# Patient Record
Sex: Male | Born: 1975 | Race: White | Hispanic: No | Marital: Married | Smoking: Former smoker
Health system: Southern US, Community
[De-identification: ages and names within clinical notes are randomized; demographics above are authoritative.]

## PROBLEM LIST (undated history)

## (undated) DIAGNOSIS — K219 Gastro-esophageal reflux disease without esophagitis: Secondary | ICD-10-CM

## (undated) DIAGNOSIS — Z8489 Family history of other specified conditions: Secondary | ICD-10-CM

## (undated) DIAGNOSIS — G4730 Sleep apnea, unspecified: Secondary | ICD-10-CM

## (undated) DIAGNOSIS — J309 Allergic rhinitis, unspecified: Secondary | ICD-10-CM

## (undated) HISTORY — PX: HERNIA REPAIR: SHX51

## (undated) HISTORY — PX: DIAGNOSTIC LAPAROSCOPY: SUR761

---

## 2004-07-10 ENCOUNTER — Emergency Department: Payer: Self-pay | Admitting: Unknown Physician Specialty

## 2005-11-05 ENCOUNTER — Emergency Department: Payer: Self-pay | Admitting: Emergency Medicine

## 2007-03-11 HISTORY — PX: NASAL SEPTUM SURGERY: SHX37

## 2007-04-13 ENCOUNTER — Ambulatory Visit: Payer: Self-pay | Admitting: Internal Medicine

## 2007-11-01 ENCOUNTER — Ambulatory Visit: Payer: Self-pay | Admitting: Otolaryngology

## 2008-11-28 ENCOUNTER — Ambulatory Visit: Payer: Self-pay | Admitting: Internal Medicine

## 2009-03-10 HISTORY — PX: ACHILLES TENDON SURGERY: SHX542

## 2011-03-11 HISTORY — PX: COLON SURGERY: SHX602

## 2011-10-16 ENCOUNTER — Ambulatory Visit: Payer: Self-pay

## 2011-12-16 ENCOUNTER — Ambulatory Visit: Payer: Self-pay | Admitting: Internal Medicine

## 2011-12-16 ENCOUNTER — Inpatient Hospital Stay: Payer: Self-pay | Admitting: Surgery

## 2011-12-16 LAB — COMPREHENSIVE METABOLIC PANEL
Anion Gap: 8 (ref 7–16)
BUN: 7 mg/dL (ref 7–18)
Calcium, Total: 9.4 mg/dL (ref 8.5–10.1)
Co2: 27 mmol/L (ref 21–32)
EGFR (Non-African Amer.): >60
Osmolality: 271 (ref 275–301)
Potassium: 3.8 mmol/L (ref 3.5–5.1)
SGOT(AST): 15 U/L (ref 15–37)
SGPT (ALT): 34 U/L (ref 12–78)
Sodium: 137 mmol/L (ref 136–145)

## 2011-12-16 LAB — CBC WITH DIFFERENTIAL/PLATELET
Basophil #: 0.1 10*3/uL (ref 0.0–0.1)
Eosinophil #: 0.2 10*3/uL (ref 0.0–0.7)
Lymphocyte %: 19.8 %
MCHC: 34 g/dL (ref 32.0–36.0)
MCV: 87 fL (ref 80–100)
Monocyte #: 1.1 x10 3/mm — ABNORMAL HIGH (ref 0.2–1.0)
Monocyte %: 7.9 %
Neutrophil #: 10.1 10*3/uL — ABNORMAL HIGH (ref 1.4–6.5)
Neutrophil %: 69.8 %
Platelet: 276 10*3/uL (ref 150–440)
RBC: 5.13 10*6/uL (ref 4.40–5.90)
RDW: 13.6 % (ref 11.5–14.5)
WBC: 14.4 10*3/uL — ABNORMAL HIGH (ref 3.8–10.6)

## 2011-12-16 LAB — URINALYSIS, COMPLETE
Bilirubin,UR: NEGATIVE
Glucose,UR: NEGATIVE mg/dL (ref 0–75)
Ketone: NEGATIVE
Nitrite: NEGATIVE
Ph: 6 (ref 4.5–8.0)
Protein: NEGATIVE
Specific Gravity: 1.02 (ref 1.003–1.030)

## 2011-12-17 LAB — BASIC METABOLIC PANEL
Calcium, Total: 8.8 mg/dL (ref 8.5–10.1)
Chloride: 104 mmol/L (ref 98–107)
Co2: 24 mmol/L (ref 21–32)
Creatinine: 0.82 mg/dL (ref 0.60–1.30)
EGFR (African American): >60
EGFR (Non-African Amer.): >60
Glucose: 110 mg/dL — ABNORMAL HIGH (ref 65–99)
Sodium: 137 mmol/L (ref 136–145)

## 2011-12-17 LAB — CBC WITH DIFFERENTIAL/PLATELET
Basophil %: 0.5 %
Eosinophil #: 0.1 10*3/uL (ref 0.0–0.7)
HCT: 42 % (ref 40.0–52.0)
HGB: 14.4 g/dL (ref 13.0–18.0)
Lymphocyte #: 1.4 10*3/uL (ref 1.0–3.6)
Lymphocyte %: 12 %
MCHC: 34.3 g/dL (ref 32.0–36.0)
Monocyte %: 7.5 %
Neutrophil #: 9.4 10*3/uL — ABNORMAL HIGH (ref 1.4–6.5)
Platelet: 279 10*3/uL (ref 150–440)
RDW: 13.7 % (ref 11.5–14.5)
WBC: 11.9 10*3/uL — ABNORMAL HIGH (ref 3.8–10.6)

## 2011-12-18 LAB — BASIC METABOLIC PANEL
Anion Gap: 11 (ref 7–16)
Calcium, Total: 9.1 mg/dL (ref 8.5–10.1)
Chloride: 102 mmol/L (ref 98–107)
Glucose: 104 mg/dL — ABNORMAL HIGH (ref 65–99)
Osmolality: 271 (ref 275–301)
Potassium: 3.9 mmol/L (ref 3.5–5.1)

## 2011-12-18 LAB — CBC WITH DIFFERENTIAL/PLATELET
Basophil %: 0.7 %
HCT: 40.3 % (ref 40.0–52.0)
HGB: 13.8 g/dL (ref 13.0–18.0)
Lymphocyte #: 1.9 10*3/uL (ref 1.0–3.6)
Lymphocyte %: 14.8 %
MCH: 29.6 pg (ref 26.0–34.0)
MCHC: 34.3 g/dL (ref 32.0–36.0)
MCV: 87 fL (ref 80–100)
Neutrophil #: 9.7 10*3/uL — ABNORMAL HIGH (ref 1.4–6.5)
Platelet: 279 10*3/uL (ref 150–440)
RDW: 13.3 % (ref 11.5–14.5)

## 2011-12-19 LAB — CBC WITH DIFFERENTIAL/PLATELET
Basophil %: 1.1 %
Eosinophil #: 0.2 10*3/uL (ref 0.0–0.7)
Eosinophil %: 2.1 %
Lymphocyte #: 1.9 10*3/uL (ref 1.0–3.6)
MCH: 30.7 pg (ref 26.0–34.0)
MCHC: 35.2 g/dL (ref 32.0–36.0)
MCV: 87 fL (ref 80–100)
Monocyte #: 0.6 x10 3/mm (ref 0.2–1.0)
Neutrophil %: 65.4 %
Platelet: 280 10*3/uL (ref 150–440)
RBC: 5.01 10*6/uL (ref 4.40–5.90)

## 2011-12-22 LAB — CULTURE, BLOOD (SINGLE)

## 2012-03-01 ENCOUNTER — Ambulatory Visit: Payer: Self-pay | Admitting: Unknown Physician Specialty

## 2012-04-09 ENCOUNTER — Ambulatory Visit: Payer: Self-pay | Admitting: Unknown Physician Specialty

## 2012-04-13 LAB — PATHOLOGY REPORT

## 2013-01-06 IMAGING — CR DG CHEST 2V
1 series · 2 of 2 positions shown · non-contrast
Comparison: none

REASON FOR EXAM: severe cough X 3 days
COMMENTS:

PROCEDURE:     MDR - MDR CHEST PA(OR AP) AND LATERAL  - October 16, 2011 [DATE]
RESULT:     The lungs are clear. The heart and pulmonary vessels are normal.
The bony and mediastinal structures are unremarkable. There is no effusion.
There is no pneumothorax or evidence of congestive failure.

[Series 1: pa · 0.17mm/px · 2 of 2 slices shown]
[im 1/2]
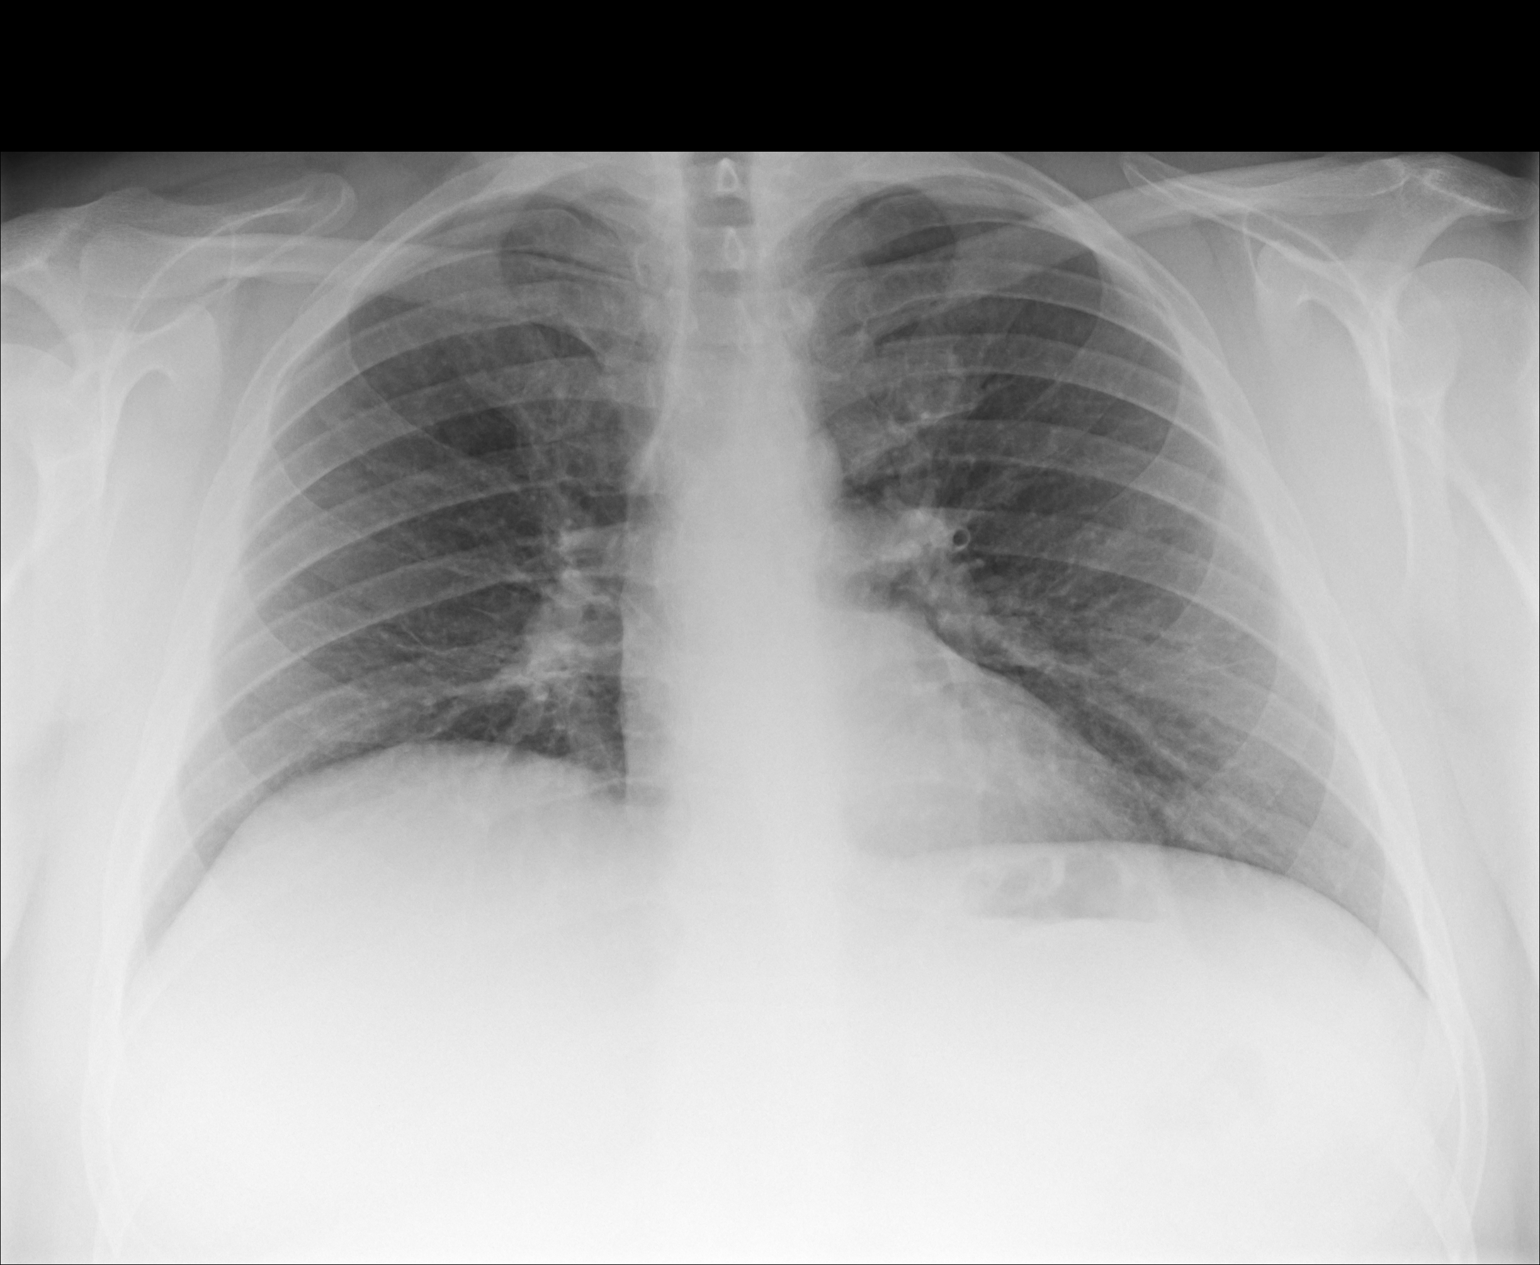
[im 2/2]
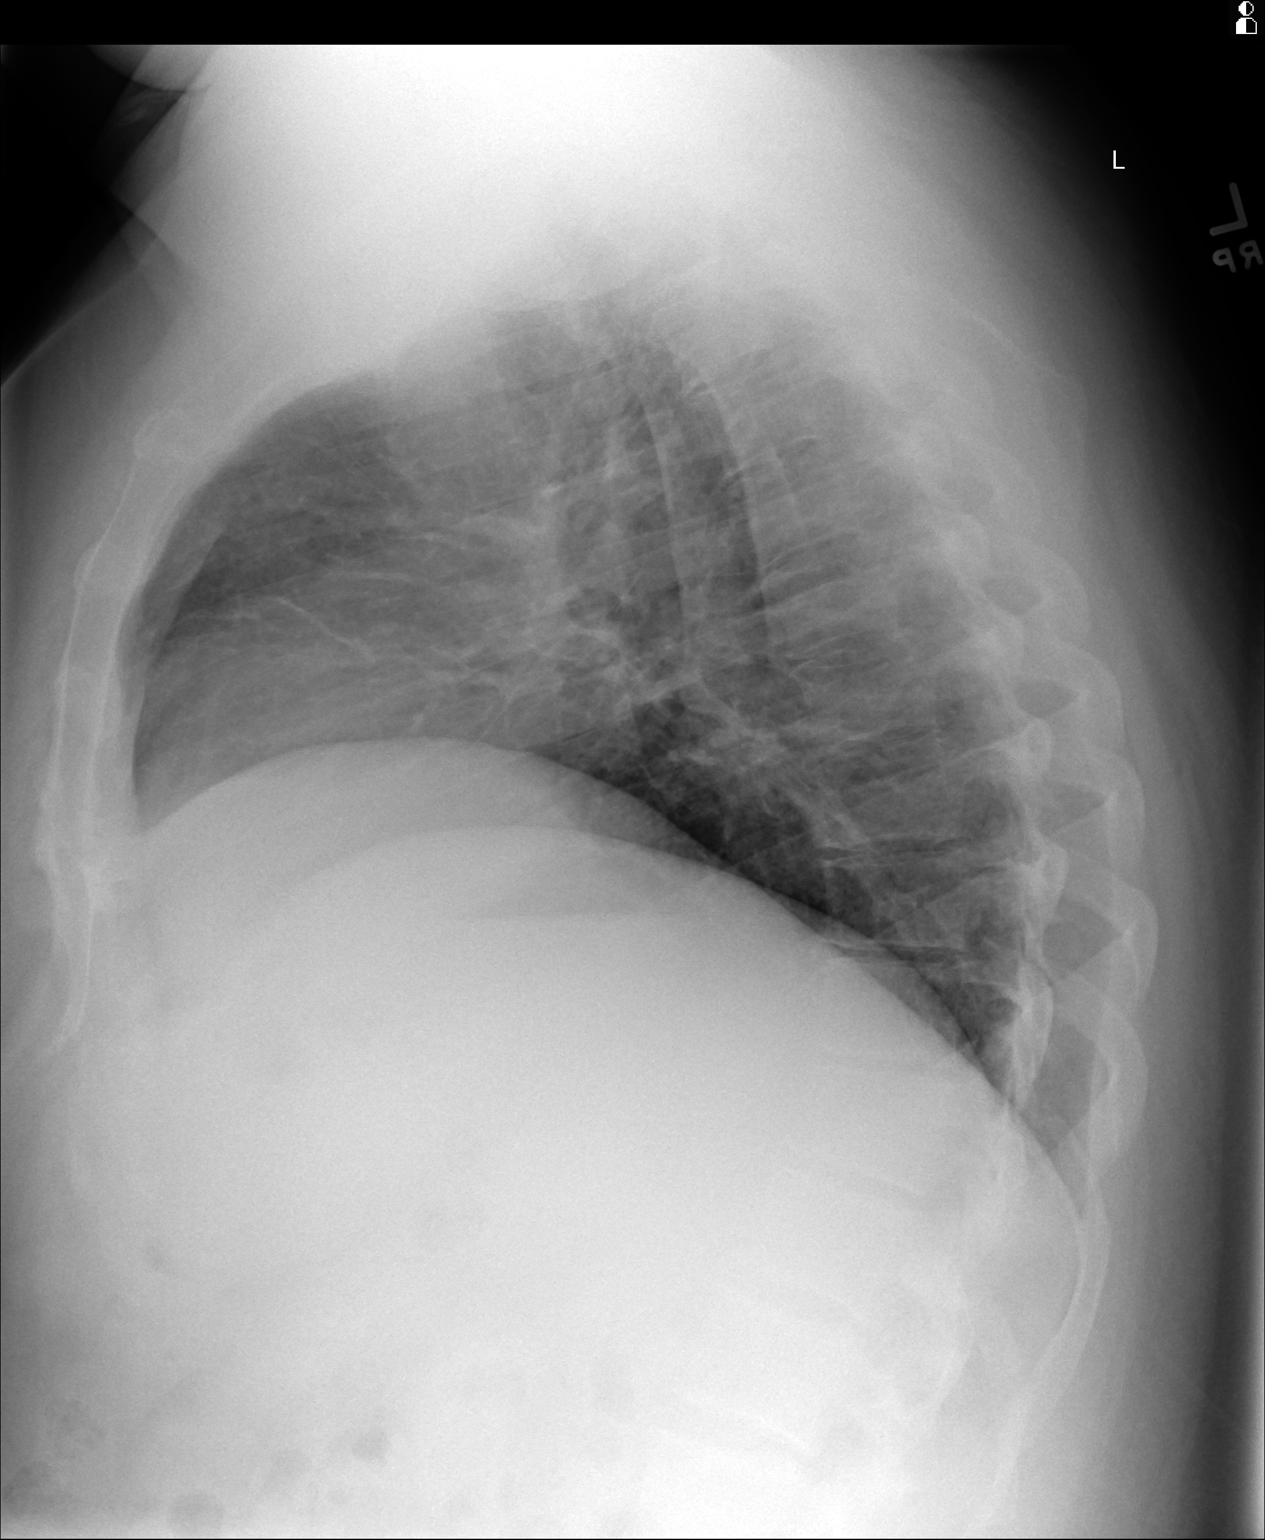

[2 of 2 positions shown; findings below may reference images not displayed]

IMPRESSION: No acute cardiopulmonary disease.

[REDACTED]

## 2013-03-15 ENCOUNTER — Ambulatory Visit: Payer: Self-pay | Admitting: Physician Assistant

## 2013-08-03 ENCOUNTER — Ambulatory Visit: Payer: Self-pay | Admitting: Physician Assistant

## 2013-08-03 LAB — RAPID STREP-A WITH REFLX: Micro Text Report: NEGATIVE

## 2013-08-07 LAB — BETA STREP CULTURE(ARMC)

## 2013-08-08 ENCOUNTER — Ambulatory Visit: Payer: Self-pay | Admitting: Physician Assistant

## 2013-12-22 ENCOUNTER — Ambulatory Visit: Payer: Self-pay | Admitting: Specialist

## 2014-03-19 ENCOUNTER — Ambulatory Visit: Payer: Self-pay | Admitting: Physician Assistant

## 2014-05-01 ENCOUNTER — Ambulatory Visit: Payer: Self-pay | Admitting: Family Medicine

## 2014-06-27 NOTE — Discharge Summary (Signed)
PATIENT NAME:  Jeremy [name], Jeremy [name] MR#:  [id number] DATE OF BIRTH:  05-Oct-1975  DATE OF ADMISSION:  12/16/2011 DATE OF DISCHARGE:  12/19/2011  DISCHARGE DIAGNOSIS: Acute diverticulitis.   PROCEDURES: None.   HISTORY OF PRESENT ILLNESS/HOSPITAL COURSE: This is a 39 year old male patient with a history of progressive and worsening left lower quadrant pain. He has had some nausea but no emesis. A work-up in the Emergency Room suggested acute diverticulitis. He was started on IV antibiotics and within 24 hours his pain had improved. He was kept on IV antibiotics and is currently being switched to oral antibiotics in the form of Bactrim and Flagyl. He is tolerating a regular diet. His pain is completely resolved. He will follow up in our office in 10 days for re-evaluation. He is given instructions to return to the office or hospital if he were to worsen. He is tolerating a diet and is discharged on Bactrim, Flagyl and Vicodin as needed.  ____________________________ Adah Salvageichard E. Excell Seltzerooper, MD rec:cms D: 12/19/2011 14:41:48 ET T: 12/19/2011 15:44:39 ET JOB#: [id number]  cc: Adah Salvageichard E. Excell Seltzerooper, MD, <Dictator> Lattie HawICHARD E Lafaye Mcelmurry MD ELECTRONICALLY SIGNED 12/19/2011 17:56

## 2014-06-27 NOTE — H&P (Signed)
PATIENT NAME:  Jeremy [name], Jeremy [name] MR#:  [id number] DATE OF BIRTH:  08/27/1975  DATE OF ADMISSION:  12/16/2011  PRIMARY CARE PHYSICIAN: None ADMITTING PHYSICIAN: Dr. Michela PitcherEly  CHIEF COMPLAINT: Abdominal pain.   BRIEF HISTORY: Jeremy [name] is a 39 year old gentleman seen in the Emergency Room with an 18 hour history of significant left lower quadrant pain. His pain came on suddenly with no real antecedent events. He did have some moderate pain several days ago which he felt was gas but did not have any serious episodes until late yesterday. Pain was sudden in onset with marked left lower quadrant pain. He had no nausea, no vomiting, no diarrhea. His wife relates he did have one bloody bowel movement several days ago. He minimizes his symptoms. He has had no previous similar symptoms. Denies any history of hepatitis, yellow jaundice, pancreatitis, peptic ulcer disease, gallbladder disease or diverticulitis. He has had a previous right inguinal hernia repair is his only previous surgery. Does not have a regular family physician. Denies any history of cardiac disease, hypertension or diabetes. His only major medical problem is gastroesophageal reflux disease. He does take Prilosec 40 mg p.o. [name].i.d. He has no medical allergies. He does not smoke cigarettes but does chew tobacco. He does not drink significant amounts of alcohol and works as a Medical illustratorsalesman.   FAMILY HISTORY: Noncontributory.    REVIEW OF SYSTEMS: Review of systems was undertaken and no significant abnormality was identified other than those noted above.   PHYSICAL EXAMINATION:  VITAL SIGNS: Blood pressure 170/76, heart rate 80 and regular, respirations 18, temperature 99.3. He is having a bit of a chill during out examination.   HEENT: No scleral icterus. No facial deformity. Normal pupils.   NECK: Supple, nontender with no adenopathy and normal midline trachea.   CHEST: Clear with no adventitious sounds and normal pulmonary excursion.   CARDIAC:  No murmurs or gallops. He seems to be in normal sinus rhythm.   ABDOMEN: Soft. He does have marked left lower quadrant tenderness with guarding. He has no rebound. He has hypoactive but present bowel sounds.   EXTREMITIES: Lower extremity exam reveals full range of motion. No deformities.   PSYCHIATRIC: Normal orientation, normal affect.   ASSESSMENT AND PLAN: I have independently reviewed the CT scan which does demonstrate what appears to be a small foci of air outside his colon which would suggest diverticulitis with a microperforation. At this point I think he would benefit with his fever, shaking chill and left lower quadrant pain from IV antibiotics. This plan has been outlined to him and his wife in detail. Questions have been answered. We will plan for admission this evening.   ____________________________ Carmie Endalph L. Ely III, MD rle:cms D: 12/16/2011 21:14:04 ET T: 12/17/2011 06:49:42 ET JOB#: [id number]  cc: Quentin Orealph L. Ely III, MD, <Dictator>  Quentin OreALPH L ELY MD ELECTRONICALLY SIGNED 12/19/2011 22:51

## 2014-11-08 ENCOUNTER — Ambulatory Visit: Payer: BLUE CROSS/BLUE SHIELD

## 2014-11-08 ENCOUNTER — Encounter: Payer: Self-pay | Admitting: Emergency Medicine

## 2014-11-08 ENCOUNTER — Ambulatory Visit
Admission: EM | Admit: 2014-11-08 | Discharge: 2014-11-08 | Disposition: A | Discharge status: Home / Self Care | Payer: BLUE CROSS/BLUE SHIELD | Attending: Emergency Medicine | Admitting: Emergency Medicine

## 2014-11-08 DIAGNOSIS — S40021A Contusion of right upper arm, initial encounter: Secondary | ICD-10-CM

## 2014-11-08 NOTE — ED Notes (Signed)
Pt hit in right forearm with a soft ball x 1 day

## 2014-11-08 NOTE — ED Provider Notes (Signed)
CSN: [id number]     Arrival date & time 11/08/14  1617 History   First MD Initiated Contact with Patient 11/08/14 1704     Chief Complaint  Patient presents with  . Arm Injury   (Consider location/radiation/quality/duration/timing/severity/associated sxs/prior Treatment) HPI   A 39 year old male who was reaching in a softball game last night when I hit ball struck him in his right distal radius. He states last night that he was having pain extending into his thumb. No numbness or tingling is present. He has some swelling and a bruise has developed where the ball hit him.  History reviewed. No pertinent past medical history. History reviewed. No pertinent past surgical history. History reviewed. No pertinent family history. Social History  Substance Use Topics  . Smoking status: Never Smoker   . Smokeless tobacco: None  . Alcohol Use: Yes    Review of Systems  Skin: Positive for color change.  All other systems reviewed and are negative.   Allergies  Review of patient's allergies indicates no known allergies.  Home Medications   Prior to Admission medications   Not on File   Meds Ordered and Administered this Visit  Medications - No data to display  BP 171/85 mmHg  Pulse 67  Temp(Src) 98.4 F (36.9 C) (Oral)  Resp 18  Ht 5' 11.5" (1.816 m)  Wt 298 lb (135.172 kg)  BMI 40.99 kg/m2  SpO2 99% No data found.   Physical Exam  Constitutional: He is oriented to person, place, and time. He appears well-developed and well-nourished.  HENT:  Head: Normocephalic and atraumatic.  Mouth/Throat: No oropharyngeal exudate.  Eyes: Pupils are equal, round, and reactive to light.  Musculoskeletal: He exhibits edema.  Examination of the right upper extremity shows a bruise over the radius distally approximate one hand's breath proximal to the radial styloid. Abduction of the thumb is intact and strong ; Finkelstein test does create some pain in the area  But there is no tenderness  over the distal radial styloid itself. Pronation and supination are full and intact. There is no hypesthesia to light touch.  Neurological: He is alert and oriented to person, place, and time.  Skin: Skin is warm and dry.  Psychiatric: He has a normal mood and affect. His behavior is normal. Judgment and thought content normal.  Nursing note and vitals reviewed.   ED Course  Procedures (including critical care time)  Labs Review Labs Reviewed - No data to display  Imaging Review Dg Forearm Right  11/08/2014   CLINICAL DATA:  Playing softball last night and injured distal radius along the radial aspect of the wrist.  EXAM: RIGHT FOREARM - 2 VIEW  COMPARISON:  None.  FINDINGS: There is no evidence of fracture or other focal bone lesions. Soft tissues are unremarkable.  IMPRESSION: Negative.   Electronically Signed   By: Elige Ko   On: 11/08/2014 18:01     Visual Acuity Review  Right Eye Distance:   Left Eye Distance:   Bilateral Distance:    Right Eye Near:   Left Eye Near:    Bilateral Near:         MDM   1. Contusion of arm, right, initial encounter    Plan: 1. Test/x-ray results and diagnosis reviewed with patient in detail 2. rx as per orders; risks, benefits, potential side effects reviewed with patient 3. Recommend supportive treatment with ice/elevation. Ibuprofen PRN pain 4. F/u prn if symptoms worsen or don't improve.  Lutricia Feil, PA-C 11/08/14 [phone]

## 2015-01-01 ENCOUNTER — Encounter: Payer: Self-pay | Admitting: Emergency Medicine

## 2015-01-01 ENCOUNTER — Ambulatory Visit
Admission: EM | Admit: 2015-01-01 | Discharge: 2015-01-01 | Disposition: A | Discharge status: Home / Self Care | Payer: BLUE CROSS/BLUE SHIELD | Attending: Family Medicine | Admitting: Family Medicine

## 2015-01-01 DIAGNOSIS — H6593 Unspecified nonsuppurative otitis media, bilateral: Secondary | ICD-10-CM

## 2015-01-01 DIAGNOSIS — J0111 Acute recurrent frontal sinusitis: Secondary | ICD-10-CM

## 2015-01-01 MED ORDER — ACETAMINOPHEN 500 MG PO TABS: 500.0000 mg | ORAL_TABLET | Freq: Four times a day (QID) | ORAL | Status: DC | PRN

## 2015-01-01 MED ORDER — FLUTICASONE PROPIONATE 50 MCG/ACT NA SUSP: 1.0000 | Freq: Two times a day (BID) | NASAL | Status: DC

## 2015-01-01 MED ORDER — SALINE SPRAY 0.65 % NA SOLN: 2.0000 | NASAL | Status: DC

## 2015-01-01 MED ORDER — AMOXICILLIN-POT CLAVULANATE 875-125 MG PO TABS: 1.0000 | ORAL_TABLET | Freq: Two times a day (BID) | ORAL | Status: DC

## 2015-01-01 NOTE — ED Provider Notes (Signed)
CSN: [id number]     Arrival date & time 01/01/15  0911 History   First MD Initiated Contact with Patient 01/01/15 [phone]     Chief Complaint  Patient presents with  . Facial Pain   (Consider location/radiation/quality/duration/timing/severity/associated sxs/prior Treatment) HPI Comments: Married caucasian male here for evaluation of sinus pressure/pain, foul smell in nose and taste in mouth and headache.  Feels like my last sinus infection and ran out of flonase.  Have been doing daily nasal rinses without any relief.  I do not use afrin anymore got hooked on it and daily use for months last year.  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  This year sinus infection x 2 otherwise previously years ago. FHx denied; PMHx: sinus infection this year  PSHx denied  The history is provided by the patient.    History reviewed. No pertinent past medical history. History reviewed. No pertinent past surgical history. History reviewed. No pertinent family history. Social History  Substance Use Topics  . Smoking status: Never Smoker   . Smokeless tobacco: None  . Alcohol Use: Yes    Review of Systems  Constitutional: Negative for fever, chills, diaphoresis, activity change, appetite change, fatigue and unexpected weight change.  HENT: Positive for congestion, postnasal drip and sinus pressure. Negative for dental problem, drooling, ear discharge, ear pain, facial swelling, hearing loss, mouth sores, nosebleeds, rhinorrhea, sneezing, sore throat, tinnitus, trouble swallowing and voice change.   Eyes: Negative for photophobia, pain, discharge, redness, itching and visual disturbance.  Respiratory: Negative for cough, choking, chest tightness, shortness of breath, wheezing and stridor.   Cardiovascular: Negative for chest pain, palpitations and leg swelling.  Gastrointestinal: Negative for nausea, vomiting, abdominal pain, diarrhea, constipation, blood in stool and abdominal distention.  Endocrine: Negative for cold  intolerance and heat intolerance.  Genitourinary: Negative for difficulty urinating.  Musculoskeletal: Negative for myalgias, back pain, joint swelling, arthralgias, gait problem, neck pain and neck stiffness.  Skin: Negative for color change, pallor, rash and wound.  Allergic/Immunologic: Negative for environmental allergies, food allergies and immunocompromised state.  Neurological: Positive for headaches. Negative for dizziness, tremors, seizures, syncope, facial asymmetry, speech difficulty, weakness, light-headedness and numbness.  Hematological: Negative for adenopathy. Does not bruise/bleed easily.  Psychiatric/Behavioral: Negative for behavioral problems, confusion, sleep disturbance and agitation.    Allergies  Review of patient's allergies indicates no known allergies.  Home Medications   Prior to Admission medications   Medication Sig Start Date End Date Taking? Authorizing Provider  acetaminophen (TYLENOL) 500 MG tablet Take 1 tablet (500 mg total) by mouth every 6 (six) hours as needed for moderate pain or fever. 01/01/15   Barbaraann Barthel, NP  amoxicillin-clavulanate (AUGMENTIN) 875-125 MG tablet Take 1 tablet by mouth every 12 (twelve) hours. 01/01/15   Barbaraann Barthel, NP  fluticasone (FLONASE) 50 MCG/ACT nasal spray Place 1 spray into both nostrils 2 (two) times daily. 01/01/15   Barbaraann Barthel, NP  sodium chloride (OCEAN) 0.65 % SOLN nasal spray Place 2 sprays into both nostrils every 2 (two) hours while awake. 01/01/15   Barbaraann Barthel, NP   Meds Ordered and Administered this Visit  Medications - No data to display  BP 148/85 mmHg  Pulse 73  Temp(Src) 98 F (36.7 C) (Tympanic)  Resp 20  Ht 5' 10.3" (1.786 m)  Wt 301 lb (136.533 kg)  BMI 42.80 kg/m2  SpO2 100% No data found.   Physical Exam  Constitutional: He is oriented to person, place, and time. Vital signs are  normal. He appears well-developed and well-nourished. He is active and cooperative.   Non-toxic appearance. He does not have a sickly appearance. He appears ill. No distress.  HENT:  Head: Normocephalic and atraumatic.  Right Ear: Hearing, external ear and ear canal normal. A middle ear effusion is present.  Left Ear: Hearing, external ear and ear canal normal. A middle ear effusion is present.  Nose: Mucosal edema and rhinorrhea present. No nose lacerations, sinus tenderness, nasal deformity, septal deviation or nasal septal hematoma. No epistaxis.  No foreign bodies. Right sinus exhibits frontal sinus tenderness. Right sinus exhibits no maxillary sinus tenderness. Left sinus exhibits frontal sinus tenderness. Left sinus exhibits no maxillary sinus tenderness.  Mouth/Throat: Uvula is midline and mucous membranes are normal. Mucous membranes are not pale, not dry and not cyanotic. He does not have dentures. No oral lesions. No trismus in the jaw. Normal dentition. No dental abscesses, uvula swelling, lacerations or dental caries. Posterior oropharyngeal edema and posterior oropharyngeal erythema present. No oropharyngeal exudate or tonsillar abscesses.  Bilateral nasal turbinates with edema/erythema clear discharge; cobblestoning posterior pharynx; bilateral TMs with air fluid level, nasal congestion  Eyes: Conjunctivae, EOM and lids are normal. Pupils are equal, round, and reactive to light. Right eye exhibits no chemosis, no discharge, no exudate and no hordeolum. No foreign body present in the right eye. Left eye exhibits no chemosis, no discharge, no exudate and no hordeolum. No foreign body present in the left eye. Right conjunctiva is not injected. Right conjunctiva has no hemorrhage. Left conjunctiva is not injected. Left conjunctiva has no hemorrhage. No scleral icterus. Right eye exhibits normal extraocular motion and no nystagmus. Left eye exhibits normal extraocular motion and no nystagmus. Right pupil is round and reactive. Left pupil is round and reactive. Pupils are equal.   Neck: Trachea normal and normal range of motion. Neck supple. No tracheal tenderness and no spinous process tenderness present. No rigidity. No tracheal deviation, no edema, no erythema and normal range of motion present. No thyroid mass and no thyromegaly present.  Cardiovascular: Normal rate, regular rhythm, S1 normal, S2 normal, normal heart sounds and intact distal pulses.  PMI is not displaced.  Exam reveals no gallop and no friction rub.   No murmur heard. Pulmonary/Chest: Effort normal and breath sounds normal. No accessory muscle usage or stridor. No respiratory distress. He has no decreased breath sounds. He has no wheezes. He has no rhonchi. He has no rales. He exhibits no tenderness.  Abdominal: Soft. He exhibits no distension.  Musculoskeletal: Normal range of motion. He exhibits no edema or tenderness.       Right shoulder: Normal.       Left shoulder: Normal.       Right hip: Normal.       Left hip: Normal.       Cervical back: Normal.       Right hand: Normal.       Left hand: Normal.  Lymphadenopathy:       Head (right side): No submental, no submandibular, no tonsillar, no preauricular, no posterior auricular and no occipital adenopathy present.       Head (left side): No submental, no submandibular, no tonsillar, no preauricular, no posterior auricular and no occipital adenopathy present.    He has no cervical adenopathy.       Right cervical: No superficial cervical, no deep cervical and no posterior cervical adenopathy present.      Left cervical: No superficial cervical, no deep cervical and  no posterior cervical adenopathy present.  Neurological: He is alert and oriented to person, place, and time. He displays no atrophy and no tremor. No cranial nerve deficit or sensory deficit. He exhibits normal muscle tone. He displays no seizure activity. Coordination and gait normal. GCS eye subscore is 4. GCS verbal subscore is 5. GCS motor subscore is 6.  Skin: Skin is warm, dry  and intact. No abrasion, no bruising, no burn, no ecchymosis, no laceration, no lesion, no petechiae and no rash noted. He is not diaphoretic. No cyanosis or erythema. No pallor. Nails show no clubbing.  Psychiatric: He has a normal mood and affect. His speech is normal and behavior is normal. Judgment and thought content normal. Cognition and memory are normal.  Nursing note and vitals reviewed.   ED Course  Procedures (including critical care time)  Labs Review Labs Reviewed - No data to display  Imaging Review No results found.     MDM   1. Acute recurrent frontal sinusitis   2. Otitis media with effusion, bilateral    Work excuse x 24 hours given to patient.  Start flonase 1 spray each nostril BID.  If no improvement with saline 2 sprays each nostril q2h prn congestion and flonase x 48-72 hours start augmentin 875mg  po BID x 10 days.  Motrin 800mg  po TID and/or tylenol 1000mg  po QID prn pain/headache/fever.  No evidence of systemic bacterial infection, non toxic and well hydrated.  I do not see where any further testing or imaging is necessary at this time.   I will suggest supportive care, rest, good hygiene and encourage the patient to take adequate fluids.  The patient is to return to clinic or EMERGENCY ROOM if symptoms worsen or change significantly.  Exitcare handout on sinusitis given to patient.  Patient verbalized agreement and understanding of treatment plan and had no further questions at this time.   P2:  Hand washing and cover cough  Supportive treatment.   No evidence of invasive bacterial infection, non toxic and well hydrated.  This is most likely self limiting viral infection.  I do not see where any further testing or imaging is necessary at this time.   I will suggest supportive care, rest, good hygiene and encourage the patient to take adequate fluids.  The patient is to return to clinic or EMERGENCY ROOM if symptoms worsen or change significantly e.g. ear pain,  fever, purulent discharge from ears or bleeding.  Exitcare handout on otitis media with effusion given to patient.  Patient verbalized agreement and understanding of treatment plan.     Barbaraann Barthelina A Paralee Pendergrass, NP 01/01/15 1415

## 2015-01-01 NOTE — Discharge Instructions (Signed)
Otitis Media With Effusion Otitis media with effusion is the presence of fluid in the middle ear. This is a common problem in children, which often follows ear infections. It may be present for weeks or longer after the infection. Unlike an acute ear infection, otitis media with effusion refers only to fluid behind the ear drum and not infection. Children with repeated ear and sinus infections and allergy problems are the most likely to get otitis media with effusion. CAUSES  The most frequent cause of the fluid buildup is dysfunction of the eustachian tubes. These are the tubes that drain fluid in the ears to the back of the nose (nasopharynx). SYMPTOMS   The main symptom of this condition is hearing loss. As a result, you or your child may:  Listen to the TV at a loud volume.  Not respond to questions.  Ask "what" often when spoken to.  Mistake or confuse one sound or word for another.  There may be a sensation of fullness or pressure but usually not pain. DIAGNOSIS   Your health care provider will diagnose this condition by examining you or your child's ears.  Your health care provider may test the pressure in you or your child's ear with a tympanometer.  A hearing test may be conducted if the problem persists. TREATMENT   Treatment depends on the duration and the effects of the effusion.  Antibiotics, decongestants, nose drops, and cortisone-type drugs (tablets or nasal spray) may not be helpful.  Children with persistent ear effusions may have delayed language or behavioral problems. Children at risk for developmental delays in hearing, learning, and speech may require referral to a specialist earlier than children not at risk.  You or your child's health care provider may suggest a referral to an ear, nose, and throat surgeon for treatment. The following may help restore normal hearing:  Drainage of fluid.  Placement of ear tubes (tympanostomy tubes).  Removal of adenoids  (adenoidectomy). HOME CARE INSTRUCTIONS   Avoid secondhand smoke.  Infants who are breastfed are less likely to have this condition.  Avoid feeding infants while they are lying flat.  Avoid known environmental allergens.  Avoid people who are sick. SEEK MEDICAL CARE IF:   Hearing is not better in 3 months.  Hearing is worse.  Ear pain.  Drainage from the ear.  Dizziness. MAKE SURE YOU:   Understand these instructions.  Will watch your condition.  Will get help right away if you are not doing well or get worse.   This information is not intended to replace advice given to you by your health care provider. Make sure you discuss any questions you have with your health care provider.   Document Released: 04/03/2004 Document Revised: 03/17/2014 Document Reviewed: 09/21/2012 Elsevier Interactive Patient Education 2016 Elsevier Inc[employer] Sinusitis, Adult Sinusitis is redness, soreness, and inflammation of the paranasal sinuses. Paranasal sinuses are air pockets within the bones of your face. They are located beneath your eyes, in the middle of your forehead, and above your eyes. In healthy paranasal sinuses, mucus is able to drain out, and air is able to circulate through them by way of your nose. However, when your paranasal sinuses are inflamed, mucus and air can become trapped. This can allow bacteria and other germs to grow and cause infection. Sinusitis can develop quickly and last only a short time (acute) or continue over a long period (chronic). Sinusitis that lasts for more than 12 weeks is considered chronic. CAUSES Causes of sinusitis   include:  Allergies.  Structural abnormalities, such as displacement of the cartilage that separates your nostrils (deviated septum), which can decrease the air flow through your nose and sinuses and affect sinus drainage.  Functional abnormalities, such as when the small hairs (cilia) that line your sinuses and help remove mucus do not work  properly or are not present. SIGNS AND SYMPTOMS Symptoms of acute and chronic sinusitis are the same. The primary symptoms are pain and pressure around the affected sinuses. Other symptoms include:  Upper toothache.  Earache.  Headache.  Bad breath.  Decreased sense of smell and taste.  A cough, which worsens when you are lying flat.  Fatigue.  Fever.  Thick drainage from your nose, which often is green and may contain pus (purulent).  Swelling and warmth over the affected sinuses. DIAGNOSIS Your health care provider will perform a physical exam. During your exam, your health care provider may perform any of the following to help determine if you have acute sinusitis or chronic sinusitis:  Look in your nose for signs of abnormal growths in your nostrils (nasal polyps).  Tap over the affected sinus to check for signs of infection.  View the inside of your sinuses using an imaging device that has a light attached (endoscope). If your health care provider suspects that you have chronic sinusitis, one or more of the following tests may be recommended:  Allergy tests.  Nasal culture. A sample of mucus is taken from your nose, sent to a lab, and screened for bacteria.  Nasal cytology. A sample of mucus is taken from your nose and examined by your health care provider to determine if your sinusitis is related to an allergy. TREATMENT Most cases of acute sinusitis are related to a viral infection and will resolve on their own within 10 days. Sometimes, medicines are prescribed to help relieve symptoms of both acute and chronic sinusitis. These may include pain medicines, decongestants, nasal steroid sprays, or saline sprays. However, for sinusitis related to a bacterial infection, your health care provider will prescribe antibiotic medicines. These are medicines that will help kill the bacteria causing the infection. Rarely, sinusitis is caused by a fungal infection. In these cases,  your health care provider will prescribe antifungal medicine. For some cases of chronic sinusitis, surgery is needed. Generally, these are cases in which sinusitis recurs more than 3 times per year, despite other treatments. HOME CARE INSTRUCTIONS  Drink plenty of water. Water helps thin the mucus so your sinuses can drain more easily.  Use a humidifier.  Inhale steam 3-4 times a day (for example, sit in the bathroom with the shower running).  Apply a warm, moist washcloth to your face 3-4 times a day, or as directed by your health care provider.  Use saline nasal sprays to help moisten and clean your sinuses.  Take medicines only as directed by your health care provider.  If you were prescribed either an antibiotic or antifungal medicine, finish it all even if you start to feel better. SEEK IMMEDIATE MEDICAL CARE IF:  You have increasing pain or severe headaches.  You have nausea, vomiting, or drowsiness.  You have swelling around your face.  You have vision problems.  You have a stiff neck.  You have difficulty breathing.   This information is not intended to replace advice given to you by your health care provider. Make sure you discuss any questions you have with your health care provider.   Document Released: 02/24/2005 Document Revised: 03/17/2014   Document Reviewed: 03/11/2011 Elsevier Interactive Patient Education 2016 Elsevier Inc.  

## 2015-01-01 NOTE — ED Notes (Signed)
Sinus pain and pressure

## 2015-05-29 ENCOUNTER — Ambulatory Visit
Admission: EM | Admit: 2015-05-29 | Discharge: 2015-05-29 | Disposition: A | Discharge status: Home / Self Care | Payer: BLUE CROSS/BLUE SHIELD | Attending: Family Medicine | Admitting: Family Medicine

## 2015-05-29 ENCOUNTER — Encounter: Payer: Self-pay | Admitting: Emergency Medicine

## 2015-05-29 DIAGNOSIS — J0100 Acute maxillary sinusitis, unspecified: Secondary | ICD-10-CM

## 2015-05-29 DIAGNOSIS — J40 Bronchitis, not specified as acute or chronic: Secondary | ICD-10-CM | POA: Diagnosis not present

## 2015-05-29 MED ORDER — FLUTICASONE PROPIONATE 50 MCG/ACT NA SUSP: 2.0000 | Freq: Every day | NASAL | Status: DC

## 2015-05-29 MED ORDER — AMOXICILLIN-POT CLAVULANATE 875-125 MG PO TABS: 1.0000 | ORAL_TABLET | Freq: Two times a day (BID) | ORAL | Status: DC

## 2015-05-29 MED ORDER — BENZONATATE 100 MG PO CAPS: 100.0000 mg | ORAL_CAPSULE | Freq: Three times a day (TID) | ORAL | Status: DC | PRN

## 2015-05-29 MED ORDER — HYDROCOD POLST-CPM POLST ER 10-8 MG/5ML PO SUER: 5.0000 mL | Freq: Every evening | ORAL | Status: AC | PRN

## 2015-05-29 NOTE — Discharge Instructions (Signed)
Take medication as prescribed. Rest. Drink plenty of fluids.  ° °Follow up with your primary care physician this week as needed. Return to Urgent care for new or worsening concerns.  ° ° °Sinusitis, Adult °Sinusitis is redness, soreness, and puffiness (inflammation) of the air pockets in the bones of your face (sinuses). The redness, soreness, and puffiness can cause air and mucus to get trapped in your sinuses. This can allow germs to grow and cause an infection.  °HOME CARE  °· Drink enough fluids to keep your pee (urine) clear or pale yellow. °· Use a humidifier in your home. °· Run a hot shower to create steam in the bathroom. Sit in the bathroom with the door closed. Breathe in the steam 3-4 times a day. °· Put a warm, moist washcloth on your face 3-4 times a day, or as told by your doctor. °· Use salt water sprays (saline sprays) to wet the thick fluid in your nose. This can help the sinuses drain. °· Only take medicine as told by your doctor. °GET HELP RIGHT AWAY IF:  °· Your pain gets worse. °· You have very bad headaches. °· You are sick to your stomach (nauseous). °· You throw up (vomit). °· You are very sleepy (drowsy) all the time. °· Your face is puffy (swollen). °· Your vision changes. °· You have a stiff neck. °· You have trouble breathing. °MAKE SURE YOU:  °· Understand these instructions. °· Will watch your condition. °· Will get help right away if you are not doing well or get worse. °  °This information is not intended to replace advice given to you by your health care provider. Make sure you discuss any questions you have with your health care provider. °  °Document Released: 08/13/2007 Document Revised: 03/17/2014 Document Reviewed: 09/30/2011 °Elsevier Interactive Patient Education ©2016 Elsevier Inc. ° °

## 2015-05-29 NOTE — ED Notes (Signed)
Patient c/o cough, chest congestion, sinus pressure and congestion, and HAs for a week.  Patient denies fevers.

## 2015-05-29 NOTE — ED Provider Notes (Signed)
[facility]  ____________________________________________  Time seen: Approximately 10:16 AM  I have reviewed the triage vital signs and the nursing notes.   HISTORY  Chief Complaint Cough; Facial Pain; and Nasal Congestion   HPI Jeremy [name] is a 40 y.o. male presents for complaints of 1 week of runny nose, nasal congestion, cough and chest congestion. Patient also reports sinus pressure and sinus drainage. Patient states frequently blowing  his nose and getting very thick greenish nasal drainage. Patient states that the cough is primarily a dry cough, occasionally wakes him up at night. States occasionally hears himself wheezing when he coughs. Denies any other wheezing. Reports continues to eat and drink well. Reports daughter at home with similar. Denies recent sickness.  Denies chest pain, shortness of breath, chest pain with deep breath, abdominal pain, fevers, weakness, dizziness, syncope, near syncope, extremity pain, extremity swelling.   History reviewed. No pertinent past medical history.  There are no active problems to display for this patient.   Past Surgical History  Procedure Laterality Date  . Nasal septum surgery      Current Outpatient Rx  Name  Route  Sig  Dispense  Refill  . acetaminophen (TYLENOL) 500 MG tablet   Oral   Take 1 tablet (500 mg total) by mouth every 6 (six) hours as needed for moderate pain or fever.   30 tablet   0   .           . fluticasone (FLONASE) 50 MCG/ACT nasal spray   Each Nare   Place 1 spray into both nostrils 2 (two) times daily.   16 g   0   . sodium chloride (OCEAN) 0.65 % SOLN nasal spray   Each Nare   Place 2 sprays into both nostrils every 2 (two) hours while awake.      0     Allergies Review of patient's allergies indicates no known allergies.  History reviewed. No pertinent family history.  Social History Social History  Substance Use Topics  . Smoking status: Never Smoker   . Smokeless  tobacco: None  . Alcohol Use: Yes    Review of Systems Constitutional: No fever/chills Eyes: No visual changes. ENT: No sore throat. Positive runny nose, nasal congestion, sinus pressure.  Cardiovascular: Denies chest pain. Respiratory: Denies shortness of breath. Gastrointestinal: No abdominal pain.  No nausea, no vomiting.  No diarrhea.  No constipation. Genitourinary: Negative for dysuria. Musculoskeletal: Negative for back pain. Skin: Negative for rash. Neurological: Negative for headaches, focal weakness or numbness.  10-point ROS otherwise negative.  ____________________________________________   PHYSICAL EXAM:  VITAL SIGNS: ED Triage Vitals  Enc Vitals Group     BP 05/29/15 0946 135/93 mmHg     Pulse Rate 05/29/15 0946 82     Resp 05/29/15 0946 16     Temp 05/29/15 0946 97.1 F (36.2 C)     Temp Source 05/29/15 0946 Tympanic     SpO2 05/29/15 0946 99 %     Weight 05/29/15 0946 298 lb (135.172 kg)     Height 05/29/15 0946 5\' 11"  (1.803 m)     Head Cir --      Peak Flow --      Pain Score 05/29/15 0949 1     Pain Loc --      Pain Edu? --      Excl. in GC? --   Constitutional: Alert and oriented. Well appearing and in no acute distress. Eyes: Conjunctivae are normal.  PERRL. EOMI. Head: Atraumatic.Mild to moderate tenderness to palpation bilateral frontal and maxillary sinuses. No swelling. No erythema.   Ears: no erythema, normal TMs bilaterally.   Nose: nasal congestion with bilateral nasal turbinate erythema and edema.   Mouth/Throat: Mucous membranes are moist.  Oropharynx non-erythematous.No tonsillar swelling or exudate.  Neck: No stridor.  No cervical spine tenderness to palpation. Hematological/Lymphatic/Immunilogical: No cervical lymphadenopathy. Cardiovascular: Normal rate, regular rhythm. Grossly normal heart sounds.  Good peripheral circulation. Respiratory: Normal respiratory effort.  No retractions. Lungs CTAB. No wheezes, rales or rhonchi. Good air  movement. Positive intermittent cough noted in room with mild bronchospasm wheeze with cough. Gastrointestinal: Soft and nontender. No distention. Normal Bowel sounds. No CVA tenderness. Musculoskeletal: No lower or upper extremity tenderness nor edema.  Bilateral pedal pulses equal and easily palpated. No cervical, thoracic or lumbar tenderness to palpation. No calf tenderness bilaterally.  Neurologic:  Normal speech and language. No gross focal neurologic deficits are appreciated. No gait instability. Skin:  Skin is warm, dry and intact. No rash noted. Psychiatric: Mood and affect are normal. Speech and behavior are normal.  ____________________________________________   LABS (all labs ordered are listed, but only abnormal results are displayed)  Labs Reviewed - No data to display ____________________________________________  INITIAL IMPRESSION / ASSESSMENT AND PLAN / ED COURSE  Pertinent labs & imaging results that were available during my care of the patient were reviewed by me and considered in my medical decision making (see chart for details).  Very well-appearing patient. No acute distress. One week of runny nose, nasal congestion, sinus pressure, sinus drainage and cough. Lungs clear throughout. Abdomen soft and nontender. Moist mucous membranes. Sinus pressure and sinus drainage as well as dry intermittent cough with slight bronchospasm noted. suspect sinusitis and bronchitis. Will treat patient with oral Augmentin,. Tessalon Perles during the day as well as when necessary Tussionex daily at bedtime. Encouraged rest, fluids, over-the-counter Tylenol or Percocet as needed. Encourage PCP follow-up.  Discussed follow up with Primary care physician this week. Discussed follow up and return parameters including no resolution or any worsening concerns. Patient verbalized understanding and agreed to plan.   ____________________________________________   FINAL CLINICAL IMPRESSION(S) / ED  DIAGNOSES  Final diagnoses:  Acute maxillary sinusitis, recurrence not specified  Bronchitis      Note: This dictation was prepared with Dragon dictation along with smaller phrase technology. Any transcriptional errors that result from this process are unintentional.    Renford Dills, NP 05/29/15 1331

## 2016-02-12 ENCOUNTER — Ambulatory Visit
Admission: EM | Admit: 2016-02-12 | Discharge: 2016-02-12 | Disposition: A | Discharge status: Home / Self Care | Payer: BLUE CROSS/BLUE SHIELD | Attending: Internal Medicine | Admitting: Internal Medicine

## 2016-02-12 DIAGNOSIS — B9689 Other specified bacterial agents as the cause of diseases classified elsewhere: Secondary | ICD-10-CM

## 2016-02-12 DIAGNOSIS — J209 Acute bronchitis, unspecified: Secondary | ICD-10-CM

## 2016-02-12 DIAGNOSIS — J019 Acute sinusitis, unspecified: Secondary | ICD-10-CM

## 2016-02-12 DIAGNOSIS — J208 Acute bronchitis due to other specified organisms: Secondary | ICD-10-CM

## 2016-02-12 MED ORDER — PREDNISONE 50 MG PO TABS: 50.0000 mg | ORAL_TABLET | Freq: Every day | ORAL | 0 refills | Status: DC

## 2016-02-12 MED ORDER — ALBUTEROL SULFATE HFA 108 (90 BASE) MCG/ACT IN AERS: 1.0000 | INHALATION_SPRAY | Freq: Four times a day (QID) | RESPIRATORY_TRACT | 0 refills | Status: DC | PRN

## 2016-02-12 MED ORDER — CEFDINIR 300 MG PO CAPS: 300.0000 mg | ORAL_CAPSULE | Freq: Two times a day (BID) | ORAL | 0 refills | Status: DC

## 2016-02-12 NOTE — Discharge Instructions (Addendum)
Prescriptions sent to the [facility] in [place].  Anticipate gradual improvement in the next several days; cough and congestion may take a couple weeks to resolve.  Recheck for increasing phlegm production/nasal discharge or new fever >100.5, or if not starting to improve in the next few days.

## 2016-02-12 NOTE — ED Provider Notes (Signed)
[facility]    CSN: [id number] Arrival date & time: 02/12/16  1605     History   Chief Complaint Chief Complaint  Patient presents with  . Sinus Problem    HPI Jeremy [name] is a 40 y.o. male. He presents today with runny/congested nose, cough productive in the mornings, and frontal headache. No fever, little bit of sore throat. No nausea/vomiting, no diarrhea. He does have malaise, but not particularly achiness. He has felt worse in the last 2 days. History of recurrent sinusitis. Using his wife's Flovent, and also a nasal rinse. Also has Flonase at home.    HPI  History reviewed. No pertinent past medical history.   Past Surgical History:  Procedure Laterality Date  . NASAL SEPTUM SURGERY         Home Medications    Prior to Admission medications   Medication Sig Start Date End Date Taking? Authorizing Provider  omeprazole (PRILOSEC) 20 MG capsule Take 20 mg by mouth daily.   Yes Historical Provider, MD  albuterol (PROVENTIL HFA;VENTOLIN HFA) 108 (90 Base) MCG/ACT inhaler Inhale 1-2 puffs into the lungs every 6 (six) hours as needed for wheezing or shortness of breath. 02/12/16   Eustace MooreLaura W Kolton Kienle, MD  cefdinir (OMNICEF) 300 MG capsule Take 1 capsule (300 mg total) by mouth 2 (two) times daily. 02/12/16   Eustace MooreLaura W Rakiya Krawczyk, MD  fluticasone (FLONASE) 50 MCG/ACT nasal spray Place 2 sprays into both nostrils daily. 05/29/15 06/12/15  Renford DillsLindsey Miller, NP  predniSONE (DELTASONE) 50 MG tablet Take 1 tablet (50 mg total) by mouth daily. 02/12/16   Eustace MooreLaura W Jakell Trusty, MD    Family History History reviewed. No pertinent family history.  Social History Social History  Substance Use Topics  . Smoking status: Never Smoker  . Smokeless tobacco: Current User    Types: Chew  . Alcohol use Yes     Allergies   Patient has no known allergies.   Review of Systems Review of Systems  All other systems reviewed and are negative.    Physical Exam Triage Vital Signs ED  Triage Vitals  Enc Vitals Group     BP 02/12/16 1619 (!) 156/83     Pulse Rate 02/12/16 1619 85     Resp 02/12/16 1619 17     Temp 02/12/16 1619 98.7 F (37.1 C)     Temp Source 02/12/16 1619 Oral     SpO2 02/12/16 1619 100 %     Weight 02/12/16 1617 (!) 312 lb (141.5 kg)     Height 02/12/16 1617 5\' 11"  (1.803 m)     Pain Score 02/12/16 1618 2   Updated Vital Signs BP (!) 156/83 (BP Location: Left Arm)   Pulse 85   Temp 98.7 F (37.1 C) (Oral)   Resp 17   Ht 5\' 11"  (1.803 m)   Wt (!) 312 lb (141.5 kg)   SpO2 100%   BMI 43.52 kg/m  Physical Exam  Constitutional: He is oriented to person, place, and time. No distress.  Alert, nicely groomed Sitting up in chair next to the bed. Looks tired, but not toxic  HENT:  Head: Atraumatic.  Looks slightly breathless at rest, but able to speak in complete sentences. Voice sounds very congested Bilateral TMs quite dull, no erythema Marked nasal congestion bilaterally with mucopurulent material present Throat is quite red, with prominent tonsils; large tonsil stone present in the right tonsil  Eyes:  Conjugate gaze, no eye redness/drainage  Neck: Neck supple.  Cardiovascular: Normal rate and regular rhythm.   Pulmonary/Chest: No respiratory distress.  Coarse but symmetric breath sounds throughout  Abdominal: Soft. He exhibits no distension. There is no tenderness. There is no guarding.  Musculoskeletal: Normal range of motion.  Neurological: He is alert and oriented to person, place, and time.  Skin: Skin is warm and dry.  No cyanosis  Nursing note and vitals reviewed.    UC Treatments / Results   Procedures Procedures (including critical care time) None today  Final Clinical Impressions(s) / UC Diagnoses   Final diagnoses:  Acute bacterial sinusitis  Acute bronchitis due to infection   [facility] [place].  Anticipate gradual improvement in the next several days; cough and congestion may take a couple  weeks to resolve.  Recheck for increasing phlegm production/nasal discharge or new fever >100.5, or if not starting to improve in the next few days.  New Prescriptions New Prescriptions   ALBUTEROL (PROVENTIL HFA;VENTOLIN HFA) 108 (90 BASE) MCG/ACT INHALER    Inhale 1-2 puffs into the lungs every 6 (six) hours as needed for wheezing or shortness of breath.   CEFDINIR (OMNICEF) 300 MG CAPSULE    Take 1 capsule (300 mg total) by mouth 2 (two) times daily.   PREDNISONE (DELTASONE) 50 MG TABLET    Take 1 tablet (50 mg total) by mouth daily.     Eustace MooreLaura W Emiliano Welshans, MD 02/13/16 1048

## 2016-02-12 NOTE — ED Triage Notes (Signed)
Patient complains of sinus pain and pressure. Patient states that he has been having symptoms for over 1 week.

## 2016-02-15 ENCOUNTER — Telehealth: Payer: Self-pay

## 2016-02-15 NOTE — Telephone Encounter (Signed)
Courtesy call back completed today after patient's visit at [facility]. Patient improved and will call back with any questions or concerns.  

## 2016-03-09 ENCOUNTER — Ambulatory Visit
Admission: EM | Admit: 2016-03-09 | Discharge: 2016-03-09 | Disposition: A | Discharge status: Home / Self Care | Payer: BLUE CROSS/BLUE SHIELD | Attending: Family Medicine | Admitting: Family Medicine

## 2016-03-09 ENCOUNTER — Encounter: Payer: Self-pay | Admitting: Emergency Medicine

## 2016-03-09 DIAGNOSIS — J028 Acute pharyngitis due to other specified organisms: Secondary | ICD-10-CM | POA: Diagnosis not present

## 2016-03-09 DIAGNOSIS — J0110 Acute frontal sinusitis, unspecified: Secondary | ICD-10-CM | POA: Diagnosis not present

## 2016-03-09 DIAGNOSIS — H05013 Cellulitis of bilateral orbits: Secondary | ICD-10-CM

## 2016-03-09 LAB — RAPID STREP SCREEN (MED CTR MEBANE ONLY): STREPTOCOCCUS, GROUP A SCREEN (DIRECT): NEGATIVE

## 2016-03-09 MED ORDER — CEFTRIAXONE SODIUM 1 G IJ SOLR
1.0000 g | Freq: Once | INTRAMUSCULAR | Status: AC
  Administered 2016-03-09: 1 g via INTRAMUSCULAR

## 2016-03-09 MED ORDER — CEFIXIME 400 MG PO TABS: 400.0000 mg | ORAL_TABLET | Freq: Every day | ORAL | 0 refills | Status: DC

## 2016-03-09 MED ORDER — FEXOFENADINE-PSEUDOEPHED ER 180-240 MG PO TB24: 1.0000 | ORAL_TABLET | Freq: Every day | ORAL | 0 refills | Status: DC

## 2016-03-09 MED ORDER — PREDNISONE 10 MG (21) PO TBPK: ORAL_TABLET | ORAL | 0 refills | Status: DC

## 2016-03-09 NOTE — ED Provider Notes (Signed)
[facility]    CSN: [id number] Arrival date & time: 03/09/16  0846     History   Chief Complaint Chief Complaint  Patient presents with  . Facial Pain  . Headache  . Nasal Congestion    HPI Jeremy [name] is a 40 y.o. male.   Patient's here because of redness and congestion both eyes and nasal congestion as well. Everything started Christmas Day after Christmas dinner we started from depression the redness of his eyes. He was seen here on 12 5 by Dr. Shirlee LimerickMarion placed August Omnicef that time 1 capsule twice a day for sinus infection. States things seem to cleared up then this came on him like a vengeance. He reports having some swollen tonsils and some stones on his tonsillar bed but states sore throat actually better than it was on Monday. Reports yellow-green drainage coming from his both nostrils. No fever. At this time he's had nasal septal surgery before in the past. No known drug allergies. Patient used to smoke but stopped about 7 or 8 years ago. No step temp or relevant past family medical history today's visit.    The history is provided by the patient.  Headache  Pain location:  Frontal Timing:  Constant Chronicity:  New Relieved by:  Nothing Worsened by:  Nothing Associated symptoms: congestion, cough, dizziness, eye pain, sinus pressure, sore throat and swollen glands     History reviewed. No pertinent past medical history.  There are no active problems to display for this patient.   Past Surgical History:  Procedure Laterality Date  . NASAL SEPTUM SURGERY         Home Medications    Prior to Admission medications   Medication Sig Start Date End Date Taking? Authorizing Provider  albuterol (PROVENTIL HFA;VENTOLIN HFA) 108 (90 Base) MCG/ACT inhaler Inhale 1-2 puffs into the lungs every 6 (six) hours as needed for wheezing or shortness of breath. 02/12/16   Eustace MooreLaura W Murray, MD  cefixime (SUPRAX) 400 MG tablet Take 1 tablet (400 mg total) by mouth  daily. 03/09/16   Hassan RowanEugene Tajia Szeliga, MD  fexofenadine-pseudoephedrine (ALLEGRA-D ALLERGY & CONGESTION) 180-240 MG 24 hr tablet Take 1 tablet by mouth daily. 03/09/16   Hassan RowanEugene Jiles Goya, MD  fluticasone (FLONASE) 50 MCG/ACT nasal spray Place 2 sprays into both nostrils daily. 05/29/15 06/12/15  Renford DillsLindsey Miller, NP  omeprazole (PRILOSEC) 20 MG capsule Take 20 mg by mouth daily.    Historical Provider, MD  predniSONE (STERAPRED UNI-PAK 21 TAB) 10 MG (21) TBPK tablet Sig 6 tablet day 1, 5 tablets day 2, 4 tablets day 3,,3tablets day 4, 2 tablets day 5, 1 tablet day 6 take all tablets orally 03/09/16   Hassan RowanEugene Naleyah Ohlinger, MD    Family History History reviewed. No pertinent family history.  Social History Social History  Substance Use Topics  . Smoking status: Never Smoker  . Smokeless tobacco: Current User    Types: Chew  . Alcohol use Yes     Allergies   Patient has no known allergies.   Review of Systems Review of Systems  HENT: Positive for congestion, sinus pressure and sore throat.   Eyes: Positive for pain.  Respiratory: Positive for cough.   Neurological: Positive for dizziness and headaches.  All other systems reviewed and are negative.    Physical Exam Triage Vital Signs ED Triage Vitals  Enc Vitals Group     BP 03/09/16 1019 (!) 149/80     Pulse Rate 03/09/16 1019 68  Resp 03/09/16 1019 16     Temp 03/09/16 1019 97.8 F (36.6 C)     Temp Source 03/09/16 1019 Oral     SpO2 03/09/16 1019 100 %     Weight 03/09/16 1018 300 lb (136.1 kg)     Height 03/09/16 1018 5\' 11"  (1.803 m)     Head Circumference --      Peak Flow --      Pain Score 03/09/16 1018 4     Pain Loc --      Pain Edu? --      Excl. in GC? --    No data found.   Updated Vital Signs BP (!) 149/80 (BP Location: Right Arm)   Pulse 68   Temp 97.8 F (36.6 C) (Oral)   Resp 16   Ht 5\' 11"  (1.803 m)   Wt 300 lb (136.1 kg)   SpO2 100%   BMI 41.84 kg/m   Visual Acuity Right Eye Distance:   Left Eye  Distance:   Bilateral Distance:    Right Eye Near:   Left Eye Near:    Bilateral Near:     Physical Exam  Constitutional: He is oriented to person, place, and time. He appears well-developed and well-nourished.  HENT:  Head: Normocephalic and atraumatic.  Right Ear: Hearing, tympanic membrane, external ear and ear canal normal.  Left Ear: Hearing, tympanic membrane and external ear normal.  Nose: Mucosal edema and rhinorrhea present. Right sinus exhibits maxillary sinus tenderness and frontal sinus tenderness. Left sinus exhibits frontal sinus tenderness. Left sinus exhibits no maxillary sinus tenderness.  Mouth/Throat: Uvula is midline and mucous membranes are normal. Posterior oropharyngeal erythema present.  Eyes: EOM are normal. Pupils are equal, round, and reactive to light. Right eye exhibits discharge. Left eye exhibits discharge. Right conjunctiva is injected. Left conjunctiva is injected.  Neck: Normal range of motion. Neck supple.  Cardiovascular: Normal rate and regular rhythm.   Pulmonary/Chest: Effort normal.  Musculoskeletal: Normal range of motion. He exhibits no edema or deformity.  Neurological: He is alert and oriented to person, place, and time.  Skin: Skin is warm and dry.  Psychiatric: He has a normal mood and affect. His behavior is normal.  Vitals reviewed.    UC Treatments / Results  Labs (all labs ordered are listed, but only abnormal results are displayed) Labs Reviewed  RAPID STREP SCREEN (NOT AT [facility])  CULTURE, GROUP A STREP [facility])    EKG  EKG Interpretation None       Radiology No results found.  Procedures Procedures (including critical care time)  Medications Ordered in UC Medications  cefTRIAXone (ROCEPHIN) injection 1 g (1 g Intramuscular Given 03/09/16 1144)     Initial Impression / Assessment and Plan / UC Course  I have reviewed the triage vital signs and the nursing notes.  Pertinent labs & imaging results that were  available during my care of the patient were reviewed by me and considered in my medical decision making (see chart for details).   Results for orders placed or performed during the hospital encounter of 03/09/16  Rapid strep screen  Result Value Ref Range   Streptococcus, Group A Screen (Direct) NEGATIVE NEGATIVE   Clinical Course   Concern over the history of sinus infection returning after being treated with Omnicef earlier this month. We'll try different antibiotics Suprax very similar but still different 1 tablet day for 10 days they'll cover it place him on 6 day course of prednisone a  [place] placement Allegra-D 1 tablet daily and follow-up PCP 1-2 weeks as needed. Will give a work note for today and tomorrow.  Final Clinical Impressions(s) / UC Diagnoses   Final diagnoses:  Orbital cellulitis, bilateral  Acute frontal sinusitis, recurrence not specified  Pharyngitis due to other organism    New Prescriptions New Prescriptions   CEFIXIME (SUPRAX) 400 MG TABLET    Take 1 tablet (400 mg total) by mouth daily.   FEXOFENADINE-PSEUDOEPHEDRINE (ALLEGRA-D ALLERGY & CONGESTION) 180-240 MG 24 HR TABLET    Take 1 tablet by mouth daily.   PREDNISONE (STERAPRED UNI-PAK 21 TAB) 10 MG (21) TBPK TABLET    Sig 6 tablet day 1, 5 tablets day 2, 4 tablets day 3,,3tablets day 4, 2 tablets day 5, 1 tablet day 6 take all tablets orally     Hassan Rowan, MD 03/09/16 1156

## 2016-03-09 NOTE — ED Triage Notes (Signed)
Patient c/o sinus congestion and pressure, nasal congestion, watery eyes and HAs started Tuesday.

## 2016-03-09 NOTE — ED Notes (Signed)
Patient shows no signs of adverse reaction to medication at this time.  

## 2016-03-12 LAB — CULTURE, GROUP A STREP (THRC)

## 2016-04-29 ENCOUNTER — Encounter
Admission: RE | Admit: 2016-04-29 | Discharge: 2016-04-29 | Disposition: A | Discharge status: Home / Self Care | Payer: BLUE CROSS/BLUE SHIELD | Source: Ambulatory Visit | Attending: Otolaryngology | Admitting: Otolaryngology

## 2016-04-29 HISTORY — DX: Gastro-esophageal reflux disease without esophagitis: K21.9

## 2016-04-29 HISTORY — DX: Family history of other specified conditions: Z84.89

## 2016-04-29 HISTORY — DX: Sleep apnea, unspecified: G47.30

## 2016-04-29 NOTE — Patient Instructions (Signed)
  Your procedure is scheduled on: 05-05-16 Report to Same Day Surgery 2nd floor medical mall [facility](Medical Mall Entrance-take elevator on left to 2nd floor.  Check in with surgery information desk.) To find out your arrival time please call [phone] between 1PM - 3PM on 05-02-16  Remember: Instructions that are not followed completely may result in serious medical risk, up to and including death, or upon the discretion of your surgeon and anesthesiologist your surgery may need to be rescheduled.    _x___ 1. Do not eat food or drink liquids after midnight. No gum chewing or hard candies.     __x__ 2. No Alcohol for 24 hours before or after surgery.   __x__3. No Smoking for 24 prior to surgery.   ____  4. Bring all medications with you on the day of surgery if instructed.    __x__ 5. Notify your doctor if there is any change in your medical condition     (cold, fever, infections).     Do not wear jewelry, make-up, hairpins, clips or nail polish.  Do not wear lotions, powders, or perfumes. You may wear deodorant.  Do not shave 48 hours prior to surgery. Men may shave face and neck.  Do not bring valuables to the hospital.    [facility] is not responsible for any belongings or valuables.               Contacts, dentures or bridgework may not be worn into surgery.  Leave your suitcase in the car. After surgery it may be brought to your room.  For patients admitted to the hospital, discharge time is determined by your treatment team.   Patients discharged the day of surgery will not be allowed to drive home.  You will need someone to drive you home and stay with you the night of your procedure.    Please read over the following fact sheets that you were given:   [facility] Preparing for [facility] and or MRSA Information   _x___ Take these medicines the morning of surgery with A SIP OF WATER:    1. PRILOSEC  2.  3.  4.  5.  6.  ____Fleets enema or Magnesium Citrate as  directed.   ____ Use CHG Soap or sage wipes as directed on instruction sheet   ____ Use inhalers on the day of surgery and bring to hospital day of surgery  ____ Stop metformin 2 days prior to surgery    ____ Take 1/2 of usual insulin dose the night before surgery and none on the morning of  surgery.   ____ Stop Aspirin, Coumadin, Pllavix ,Eliquis, Effient, or Pradaxa  x__ Stop Anti-inflammatories such as Advil, Aleve, Ibuprofen, Motrin, Naproxen,          Naprosyn, Goodies powders or aspirin products NOW-Ok to take Tylenol.   ____ Stop supplements until after surgery.    ____ Bring C-Pap to the hospital.

## 2016-05-02 MED ORDER — PHENYLEPHRINE HCL 10 % OP SOLN
Freq: Once | OPHTHALMIC | Status: DC
  Filled 2016-05-02: qty 20

## 2016-05-02 NOTE — Pre-Procedure Instructions (Signed)
Progress Notes - in this encounter  Dione Housekeeperlmedo, Mario Ernesto, MD - 04/30/2016 5:00 PM EST Formatting of this note may be different from the original. Jeremy [name] is a 41 y.o. male who presents because he is in need of a preoperative evaluation to rule out surgical contraindications. The patient reports that he is scheduled to get sinus surgery early next week and because of this the surgeon wanted for him to be screened. To note the patient's medical problems are: Past Medical History:  Diagnosis Date  . Allergic state  . Diverticula of colon  . GERD (gastroesophageal reflux disease)  . Perforated small intestine (CMS-HCC)  . Right inguinal hernia  . Sleep apnea   Patient Active Problem List  Diagnosis  . GERD (gastroesophageal reflux disease)  . Perforated small intestine (CMS-HCC)  . Diverticula of colon  . Right inguinal hernia  . Severe obstructive sleep apnea  . Morbid obesity with BMI of 40.0-44.9 (HCC)   No Known Allergies  Prior to Admission medications  Medication Sig Taking? Last Dose  acetaminophen (TYLENOL) 500 MG tablet Take by mouth. Yes Taking  fluticasone (FLONASE) 50 mcg/actuation nasal spray Place 1 spray into both nostrils once daily. Yes Taking  omeprazole (PRILOSEC) 40 MG DR capsule Take 1 capsule (40 mg total) by mouth 2 (two) times daily. MAKE APPT FOR FURTHER REFILLS. Yes Taking  TRIAMCINOLONE ACETONIDE (NASACORT NASAL) by Nasal route as needed. Yes Taking  VENTOLIN HFA 90 mcg/actuation inhaler 1-2 INHALATIONS EVERY 4-6 HOURS AS NEEDED FOR WHEEZING. DISPENSE SPACER AS NEEDED. Yes Taking  ibuprofen (ADVIL,MOTRIN) 200 MG tablet Take 200 mg by mouth every 6 (six) hours as needed for Pain. Not Taking   Current medications, allergies, problem list, personally reviewed on Epic today.  Review of Systems General No fever, chills or recent illness, no change in weight HEENT. No change in vision or hearing, no C/O pain or difficulty swallowing Resp. No cough or  shortness of breath Cardiac. No chest pain or palpitations GI. No pain, dyspepsia or change in bowel habits GU. No dysuria, frequency or hesitancy Musculoskeletal. No joint pain or injury Neurological. No headaches, change in mental status, no loss of sensation, strength or function  ENDOCRINE. No dry skin, excessive fatigue, polyuria, polydipsia. Integumentary. No rash, pruritus, lesions, tumors.  Objective:   Vitals:  04/30/16 1650  BP: (!) 132/94  Pulse: 82  Weight: (!) 140.2 kg (309 lb)  PainSc: 0-No pain   Body mass index is 44.34 kg/m.  General. Alert and oriented, in no acute distress SKIN. No rash, lesions, normal color, no diaphoresis HEENT: [place]/AT, clear sclera, conjunctiva clear, canals clear, TM normal, nasal mucosa moist, clear nasal discharge, oral pharynx without lesions, no exudates NECK. Supple, no masses; no lymphadenopathy LUNGS. Respirations unlabored; clear to auscultation bilaterally, no wheezing, rales or ronchi.  CARDIOVASCULAR. Regular, rate, and rhythm without murmurs, gallops or rubs ABDOMEN. Soft; nontender; nondistended; no masses or organomegaly EXTREMITIES. No edema or cyanosis NEUROLOGIC. Normal mentation; moves all extremities well, sensation normal  The EKG here is fairly normal, sinus rhythm, no ST elevations or depressions. The intervals appear appropriate.  Assessment / Plan:   Preoperative evaluation to rule out surgical contraindication The physical exam is unremarkable except for his morbid obesity. We did an EKG and this also appears to be normal. The potential problem will be his severe sleep apnea. This should be managed by the anesthesiologist during the surgical procedure. Other than the sleep apnea I do not see any other contraindications to  his sinus surgery. We will fill out the appropriate forms. - ECG 12-lead  As part of the visit and considering the current Body mass index is 44.34 kg/m. we discussed with the patient the  importance of weight control including the reduction of portion sizes, decrease sweets, increase physical activity, etc  A copy of instructions have been given to the patient or responsible adult who demonstrated the ability to learn, asked appropriate questions, and verbalized understanding of the plan of care. There were no barriers to learning identified.  Portions of this note were generated with voice recognition software and may contain errors.    Plan of Treatment - as of this encounter   Not on file   ECG Results - in this encounter    ECG 12-lead (04/29/2016 5:03 PM) ECG 12-lead (04/29/2016 5:03 PM)  Component Value Ref Range  Vent Rate (bpm) 81   PR Interval (msec) 148   QRS Interval (msec) 90   QT Interval (msec) 372   QTc (msec) 432    ECG 12-lead (04/29/2016 5:03 PM)  Specimen Performing Laboratory   [facility] GE MUSE RESULTS    ECG 12-lead (04/29/2016 5:03 PM)  Narrative  Normal sinus rhythm  Normal ECG    No previous ECGs available  I reviewed and concur with this report. Electronically signed ZO:[email or web address], MD, Riley Lam [phone]) on 05/01/2016 3:04:09 PM     Visit Diagnoses    Diagnosis  Preoperative evaluation to rule out surgical contraindication - Primary   Images Document Information   Primary Care Provider Dione Housekeeper MD (Apr. 27, 2017 - Present) [phone] (Work) [phone] (Fax) [address] [place], [place] [address]  Document Coverage Dates Feb. 21, 2018  Custodian Organization [facility] [place], [place] [address]   Encounter Providers Dione Housekeeper MD (Attending) [phone] (Work) [phone] (Fax) [address] [place], [place] [address]   Encounter Date Feb. 21, 2018

## 2016-05-02 NOTE — Pre-Procedure Instructions (Signed)
Medical clearance note in Care Everywhere from Dr Zada Finderslmedo

## 2016-05-05 ENCOUNTER — Ambulatory Visit: Payer: BLUE CROSS/BLUE SHIELD | Admitting: Anesthesiology

## 2016-05-05 ENCOUNTER — Encounter: Payer: Self-pay | Admitting: *Deleted

## 2016-05-05 ENCOUNTER — Ambulatory Visit
Admission: RE | Admit: 2016-05-05 | Discharge: 2016-05-05 | Disposition: A | Discharge status: Home / Self Care | Payer: BLUE CROSS/BLUE SHIELD | Source: Ambulatory Visit | Attending: Otolaryngology | Admitting: Otolaryngology

## 2016-05-05 ENCOUNTER — Encounter
Admission: RE | Disposition: A | Discharge status: Home / Self Care | Payer: Self-pay | Source: Ambulatory Visit | Attending: Otolaryngology

## 2016-05-05 DIAGNOSIS — Z87891 Personal history of nicotine dependence: Secondary | ICD-10-CM | POA: Diagnosis not present

## 2016-05-05 DIAGNOSIS — J322 Chronic ethmoidal sinusitis: Secondary | ICD-10-CM | POA: Diagnosis not present

## 2016-05-05 DIAGNOSIS — K219 Gastro-esophageal reflux disease without esophagitis: Secondary | ICD-10-CM | POA: Insufficient documentation

## 2016-05-05 DIAGNOSIS — Z885 Allergy status to narcotic agent status: Secondary | ICD-10-CM | POA: Diagnosis not present

## 2016-05-05 DIAGNOSIS — J321 Chronic frontal sinusitis: Secondary | ICD-10-CM | POA: Diagnosis not present

## 2016-05-05 DIAGNOSIS — J320 Chronic maxillary sinusitis: Secondary | ICD-10-CM | POA: Diagnosis not present

## 2016-05-05 DIAGNOSIS — G4733 Obstructive sleep apnea (adult) (pediatric): Secondary | ICD-10-CM | POA: Diagnosis not present

## 2016-05-05 DIAGNOSIS — Z6841 Body Mass Index (BMI) 40.0 and over, adult: Secondary | ICD-10-CM | POA: Insufficient documentation

## 2016-05-05 HISTORY — PX: MAXILLARY ANTROSTOMY: SHX2003

## 2016-05-05 HISTORY — PX: IMAGE GUIDED SINUS SURGERY: SHX6570

## 2016-05-05 HISTORY — PX: FRONTAL SINUS EXPLORATION: SHX6591

## 2016-05-05 HISTORY — PX: ETHMOIDECTOMY: SHX5197

## 2016-05-05 SURGERY — SINUS SURGERY, WITH IMAGING GUIDANCE
Anesthesia: General | Wound class: Contaminated

## 2016-05-05 MED ORDER — ONDANSETRON HCL 4 MG/2ML IJ SOLN
INTRAMUSCULAR | Status: AC
  Filled 2016-05-05: qty 2

## 2016-05-05 MED ORDER — MIDAZOLAM HCL 2 MG/2ML IJ SOLN
INTRAMUSCULAR | Status: DC | PRN
  Administered 2016-05-05: 2 mg via INTRAVENOUS

## 2016-05-05 MED ORDER — OXYMETAZOLINE HCL 0.05 % NA SOLN
NASAL | Status: AC
  Filled 2016-05-05: qty 15

## 2016-05-05 MED ORDER — SODIUM CHLORIDE 0.9 % IJ SOLN
INTRAMUSCULAR | Status: AC
  Filled 2016-05-05: qty 10

## 2016-05-05 MED ORDER — LIDOCAINE HCL (PF) 2 % IJ SOLN
INTRAMUSCULAR | Status: AC
  Filled 2016-05-05: qty 2

## 2016-05-05 MED ORDER — LIDOCAINE-EPINEPHRINE (PF) 1 %-1:200000 IJ SOLN
INTRAMUSCULAR | Status: DC | PRN
  Administered 2016-05-05: 4 mL

## 2016-05-05 MED ORDER — SUGAMMADEX SODIUM 500 MG/5ML IV SOLN
INTRAVENOUS | Status: AC
  Filled 2016-05-05: qty 5

## 2016-05-05 MED ORDER — MIDAZOLAM HCL 2 MG/2ML IJ SOLN
INTRAMUSCULAR | Status: AC
  Filled 2016-05-05: qty 2

## 2016-05-05 MED ORDER — FENTANYL CITRATE (PF) 100 MCG/2ML IJ SOLN
25.0000 ug | INTRAMUSCULAR | Status: DC | PRN
  Administered 2016-05-05 (×2): 25 ug via INTRAVENOUS

## 2016-05-05 MED ORDER — ROCURONIUM BROMIDE 100 MG/10ML IV SOLN
INTRAVENOUS | Status: DC | PRN
  Administered 2016-05-05: 10 mg via INTRAVENOUS
  Administered 2016-05-05: 50 mg via INTRAVENOUS

## 2016-05-05 MED ORDER — LIDOCAINE HCL (CARDIAC) 20 MG/ML IV SOLN
INTRAVENOUS | Status: DC | PRN
  Administered 2016-05-05: 100 mg via INTRAVENOUS

## 2016-05-05 MED ORDER — ACETAMINOPHEN 10 MG/ML IV SOLN
INTRAVENOUS | Status: AC
  Filled 2016-05-05: qty 100

## 2016-05-05 MED ORDER — ROCURONIUM BROMIDE 50 MG/5ML IV SOLN
INTRAVENOUS | Status: AC
  Filled 2016-05-05: qty 1

## 2016-05-05 MED ORDER — LIDOCAINE HCL (PF) 1 % IJ SOLN
INTRAMUSCULAR | Status: AC
  Filled 2016-05-05: qty 30

## 2016-05-05 MED ORDER — SUGAMMADEX SODIUM 500 MG/5ML IV SOLN
INTRAVENOUS | Status: DC | PRN
  Administered 2016-05-05: 300 mg via INTRAVENOUS

## 2016-05-05 MED ORDER — SEVOFLURANE IN SOLN
RESPIRATORY_TRACT | Status: AC
  Filled 2016-05-05: qty 250

## 2016-05-05 MED ORDER — EPINEPHRINE PF 1 MG/ML IJ SOLN
INTRAMUSCULAR | Status: AC
Start: 2016-05-05 — End: 2016-05-05
  Filled 2016-05-05: qty 1

## 2016-05-05 MED ORDER — FENTANYL CITRATE (PF) 100 MCG/2ML IJ SOLN
INTRAMUSCULAR | Status: DC | PRN
  Administered 2016-05-05: 100 ug via INTRAVENOUS

## 2016-05-05 MED ORDER — DEXAMETHASONE SODIUM PHOSPHATE 10 MG/ML IJ SOLN
INTRAMUSCULAR | Status: AC
  Filled 2016-05-05: qty 1

## 2016-05-05 MED ORDER — LACTATED RINGERS IV SOLN
INTRAVENOUS | Status: DC
  Administered 2016-05-05: 06:00:00 via INTRAVENOUS

## 2016-05-05 MED ORDER — PROPOFOL 10 MG/ML IV BOLUS
INTRAVENOUS | Status: DC | PRN
  Administered 2016-05-05: 200 mg via INTRAVENOUS

## 2016-05-05 MED ORDER — SUGAMMADEX SODIUM 200 MG/2ML IV SOLN
INTRAVENOUS | Status: AC
  Filled 2016-05-05: qty 2

## 2016-05-05 MED ORDER — SODIUM CHLORIDE 0.9 % IJ SOLN
INTRAMUSCULAR | Status: AC
Start: 2016-05-05 — End: 2016-05-05
  Administered 2016-05-05: 10 mL
  Filled 2016-05-05: qty 10

## 2016-05-05 MED ORDER — PROMETHAZINE HCL 25 MG/ML IJ SOLN
6.2500 mg | INTRAMUSCULAR | Status: DC | PRN
  Administered 2016-05-05: 6.25 mg via INTRAVENOUS

## 2016-05-05 MED ORDER — PROPOFOL 10 MG/ML IV BOLUS
INTRAVENOUS | Status: AC
Start: 2016-05-05 — End: 2016-05-05
  Filled 2016-05-05: qty 40

## 2016-05-05 MED ORDER — PROMETHAZINE HCL 25 MG/ML IJ SOLN
INTRAMUSCULAR | Status: AC
  Filled 2016-05-05: qty 1

## 2016-05-05 MED ORDER — LACTATED RINGERS IV SOLN
INTRAVENOUS | Status: DC | PRN
  Administered 2016-05-05: 07:00:00 via INTRAVENOUS

## 2016-05-05 MED ORDER — PHENYLEPHRINE HCL 10 % OP SOLN
OPHTHALMIC | Status: DC | PRN
  Administered 2016-05-05: 20 mL via TOPICAL

## 2016-05-05 MED ORDER — SUCCINYLCHOLINE CHLORIDE 20 MG/ML IJ SOLN
INTRAMUSCULAR | Status: DC | PRN
  Administered 2016-05-05: 140 mg via INTRAVENOUS

## 2016-05-05 MED ORDER — FENTANYL CITRATE (PF) 100 MCG/2ML IJ SOLN
INTRAMUSCULAR | Status: AC
  Filled 2016-05-05: qty 2

## 2016-05-05 MED ORDER — ONDANSETRON HCL 4 MG/2ML IJ SOLN
INTRAMUSCULAR | Status: DC | PRN
Start: 2016-05-05 — End: 2016-05-05
  Administered 2016-05-05: 4 mg via INTRAVENOUS

## 2016-05-05 MED ORDER — DEXAMETHASONE SODIUM PHOSPHATE 10 MG/ML IJ SOLN
INTRAMUSCULAR | Status: DC | PRN
Start: 2016-05-05 — End: 2016-05-05
  Administered 2016-05-05: 10 mg via INTRAVENOUS

## 2016-05-05 SURGICAL SUPPLY — 32 items
BATTERY INSTRU NAVIGATION (MISCELLANEOUS) ×15 IMPLANT
CANISTER SUCT 1200ML W/VALVE (MISCELLANEOUS) ×5 IMPLANT
CANISTER SUCT 3000ML (MISCELLANEOUS) ×5 IMPLANT
COAG SUCT 10F 3.5MM HAND CTRL (MISCELLANEOUS) ×5 IMPLANT
CUP MEDICINE 2OZ PLAST GRAD ST (MISCELLANEOUS) ×5 IMPLANT
DRESSING NASL FOAM PST OP SINU (MISCELLANEOUS) IMPLANT
DRSG NASAL FOAM POST OP SINU (MISCELLANEOUS)
ELECT REM PT RETURN 9FT ADLT (ELECTROSURGICAL) ×5
ELECTRODE REM PT RTRN 9FT ADLT (ELECTROSURGICAL) ×3 IMPLANT
GLOVE BIOGEL PI IND STRL 6.5 (GLOVE) ×3 IMPLANT
GLOVE BIOGEL PI INDICATOR 6.5 (GLOVE) ×2
GLOVE PROTEXIS LATEX SZ 7.5 (GLOVE) ×10 IMPLANT
GOWN STRL REUS W/ TWL LRG LVL3 (GOWN DISPOSABLE) ×6 IMPLANT
GOWN STRL REUS W/TWL LRG LVL3 (GOWN DISPOSABLE) ×4
IV NS 500ML (IV SOLUTION) ×2
IV NS 500ML BAXH (IV SOLUTION) ×3 IMPLANT
NAVIGATION MASK REG  ST (MISCELLANEOUS) ×5 IMPLANT
NEEDLE SPNL 25GX3.5 QUINCKE BL (NEEDLE) IMPLANT
NS IRRIG 500ML POUR BTL (IV SOLUTION) ×5 IMPLANT
PACK HEAD/NECK (MISCELLANEOUS) ×5 IMPLANT
PACKING NASAL EPIS 4X2.4 XEROG (MISCELLANEOUS) ×10 IMPLANT
PATTIES SURGICAL .5 X3 (DISPOSABLE) ×5 IMPLANT
SHAVER DIEGO BLD STD TYPE A (BLADE) ×5 IMPLANT
SOL ANTI-FOG 6CC FOG-OUT (MISCELLANEOUS) ×3 IMPLANT
SOL FOG-OUT ANTI-FOG 6CC (MISCELLANEOUS) ×2
SPOGE SURGIFLO 8M (HEMOSTASIS)
SPONGE SURGIFLO 8M (HEMOSTASIS) IMPLANT
SWAB CULTURE AMIES ANAERIB BLU (MISCELLANEOUS) ×5 IMPLANT
SYR 20CC LL (SYRINGE) ×5 IMPLANT
SYR 3ML LL SCALE MARK (SYRINGE) ×5 IMPLANT
TUBING DECLOG MULTIDEBRIDER (TUBING) ×5 IMPLANT
WATER STERILE IRR 1000ML POUR (IV SOLUTION) ×5 IMPLANT

## 2016-05-05 NOTE — Anesthesia Procedure Notes (Signed)
Procedure Name: Intubation Date/Time: 05/05/2016 7:29 AM Performed by: Darlyne Russian Pre-anesthesia Checklist: Patient identified, Emergency Drugs available, Suction available, Patient being monitored and Timeout performed Patient Re-evaluated:Patient Re-evaluated prior to inductionOxygen Delivery Method: Circle system utilized Preoxygenation: Pre-oxygenation with 100% oxygen Intubation Type: IV induction Ventilation: Mask ventilation with difficulty, Oral airway inserted - appropriate to patient size and Two handed mask ventilation required Laryngoscope Size: Mac and 4 Grade View: Grade III Tube type: Oral Rae Tube size: 8.0 mm Number of attempts: 1 Airway Equipment and Method: Oral airway and Bite block Placement Confirmation: ETT inserted through vocal cords under direct vision,  positive ETCO2 and breath sounds checked- equal and bilateral Secured at: 22 cm Tube secured with: Tape Dental Injury: Teeth and Oropharynx as per pre-operative assessment

## 2016-05-05 NOTE — H&P (Signed)
  H&P has been reviewed and no changes necessary. To be downloaded later. 

## 2016-05-05 NOTE — Op Note (Signed)
05/05/2016  9:15 AM    Cheryll DessertBriggs, Cowan  [id number]   Pre-Op Dx:  Chronic left frontal sinusitis, chronic left ethmoid sinusitis, chronic bilateral maxillary sinusitis  Post-op Dx: same   Procedure: Left endoscopic frontal sinusotomy, left endoscopic total ethmoidectomy, bilateral endoscopic maxillary antrostomies with removal of contents, use of image guided system    Surg:  Pheonix [facility] H  Anes:  GOT  EBL:   150 mL   Comp:   none   Findings:   very inflamed tissues special then the left side with creamy white mucus filling the left maxillary sinus and part of the ethmoids   ProThe patient was given general anesthesia by oral endotracheal intubation. His placed in a supine position and his nose was prepped with 4% Xylocaine mixed with Afrin on cottonoid pledgets. The image guided system was brought in and the CT scan was downloaded to the disc. The template was applied the face and registered to the system. There was 0.9 mm of variance. The suction instruments were then registered to the system and there was good alignment. He was prepped and draped sterile fashion.  The 0 scope was used to visualize both sides the nose. The septum was relatively straight. The left side was somewhat inflamed. Sections of 4 ml of 1% Xylocaine with epi 1: 200,000 was used for infiltration the uncinate process middle turbinates on both sides. The left side was addressed first. The middle turbinate was infractured and there is creamy white pus that was coming from the middle meatus. This was suctioned clear. The uncinate process was incised with backbiter and then was removed using the [facility] micro[facility]ider and through biting forceps. There is creamy pus filling the left maxillary sinus. This was suctioned with a curved suction out all that I could reach. A culture was taken from this thick mucus. The maxillary antrum was widened using the [facility] microdebrider and back biters. This was including the natural  ostium. It is still creamy mucus that was seen way to the left side and inferior. Sinus was flushed several times to make sure that this was all cleared out. No evidence for fungal ball. The 30 and 70 scopes were used to visualize the bottom of the maxillary sinus to make sure it was clear. There was inflamed mucous membranes lining the sinus but no further pockets of pus noted. The 0 scope was then used for opening up the posterior middle ethmoid air cells. This was done under direct vision with the [facility] microdebrider and through biting forceps. The middle and anterior ethmoid air cells were then opened using the 30 scope and the frontal sinus duct was opened to expose good drainage from frontal sinus. A 70 scope was used for cleaning up these areas. He was quite inflamed and had put cottonoid pledgets soaked in phenylephrine and Xylocaine in the area to help vasoconstrict this.  The 0 scope was used on the right side for visualizing the airway. The middle meatus was infractured and the uncinate process was incised with backbiter. The uncinate process was removed and the maxillary antrum was widened. The natural ostium was opened posteriorly to provide better drainage from the sinus. Thickened mucous membranes little bit of clear mucus that was all cleaned out of the sinus as much as possible. There are uncinate process had been removed as well. The ethmoid bulla was left intact. A cottonoid pledget was placed here temporarily control bleeding and the left side was readdressed.  Using the 0, 30, and 70  scopes the frontal, ethmoid, and maxillary sinuses were reevaluated to make sure that they were all open and clear. All bone shards and some loose tissue were removed help the healing process. Xerogel was placed into the anterior ethmoid at the opening to the frontal sinus duct. More xerogel was then placed in the posterior ethmoid and over the opening to the maxillary sinus. This was liquefied. The 0  scope was then used to visualize the right side and the meatus was clear. The 30 scope was used to visualize the maxillary antrum and some old blood was suctioned from year. The middle meatus was then filled with xerogel. He had a good open airway on both sides. Inferior turbinates were outfractured slightly better airway. The septum was straight.   Dispo:    The patient was awakened taken to the recovery room in satisfactory condition. There were no operative complications. He is to be discharged home from the recovery unit.   Plan:   he will rest at home with his head elevated. Start saline flushes tomorrow. He'll follow-up in 4 days in the office.   Aadhya Bustamante H  05/05/2016 9:15 AM

## 2016-05-05 NOTE — OR Nursing (Signed)
Patient uses Cpap at home encourage patient to take deep breaths

## 2016-05-05 NOTE — Anesthesia Post-op Follow-up Note (Cosign Needed)
Anesthesia QCDR form completed.        

## 2016-05-05 NOTE — Anesthesia Postprocedure Evaluation (Signed)
Anesthesia Post Note  Patient: Jeremy [name]  Procedure(s) Performed: Procedure(s) (LRB): IMAGE GUIDED SINUS SURGERY (N/A) MAXILLARY ANTROSTOMY (Bilateral) ETHMOIDECTOMY (Left) FRONTAL SINUS EXPLORATION (Left)  Patient location during evaluation: PACU Anesthesia Type: General Level of consciousness: awake and alert Pain management: pain level controlled Vital Signs Assessment: post-procedure vital signs reviewed and stable Respiratory status: spontaneous breathing, nonlabored ventilation, respiratory function stable and patient connected to nasal cannula oxygen Cardiovascular status: blood pressure returned to baseline and stable Postop Assessment: no signs of nausea or vomiting Anesthetic complications: no     Last Vitals:  Vitals:   05/05/16 1045 05/05/16 1110  BP: (!) 149/83 (!) 147/95  Pulse: 68 72  Resp: 16 16  Temp: 36.1 C     Last Pain:  Vitals:   05/05/16 1110  TempSrc:   PainSc: 4                  Lenard SimmerAndrew Analys Ryden

## 2016-05-05 NOTE — OR Nursing (Signed)
Patient resting  vss sips of coke tolerated well

## 2016-05-05 NOTE — Discharge Instructions (Signed)

## 2016-05-05 NOTE — Transfer of Care (Signed)
Immediate Anesthesia Transfer of Care Note  Patient: Jeremy [name]  Procedure(s) Performed: Procedure(s): IMAGE GUIDED SINUS SURGERY (N/A) MAXILLARY ANTROSTOMY (Bilateral) ETHMOIDECTOMY (Left) FRONTAL SINUS EXPLORATION (Left)  Patient Location: PACU  Anesthesia Type:General  Level of Consciousness: awake  Airway & Oxygen Therapy: Patient Spontanous Breathing and Patient connected to face mask oxygen  Post-op Assessment: Report given to RN and Post -op Vital signs reviewed and stable  Post vital signs: Reviewed and stable  Last Vitals:  Vitals:   05/05/16 0604 05/05/16 0930  BP: 130/88 (!) 158/99  Pulse: 67 83  Resp: 18 15  Temp: 36.8 C     Last Pain:  Vitals:   05/05/16 0930  TempSrc: Temporal         Complications: No apparent anesthesia complications

## 2016-05-05 NOTE — Anesthesia Preprocedure Evaluation (Signed)
Anesthesia Evaluation  Patient identified by MRN, date of birth, ID band Patient awake    Reviewed: Allergy & Precautions, H&P , NPO status , Patient's Chart, lab work & pertinent test results, reviewed documented beta blocker date and time   Airway Mallampati: III  TM Distance: >3 FB Neck ROM: full    Dental  (+) Caps   Pulmonary neg shortness of breath, sleep apnea and Continuous Positive Airway Pressure Ventilation , neg COPD, neg recent URI, former smoker,           Cardiovascular Exercise Tolerance: Good negative cardio ROS       Neuro/Psych negative neurological ROS  negative psych ROS   GI/Hepatic Neg liver ROS, GERD  ,  Endo/Other  neg diabetesMorbid obesity  Renal/GU negative Renal ROS  negative genitourinary   Musculoskeletal   Abdominal   Peds  Hematology negative hematology ROS (+)   Anesthesia Other Findings Past Medical History: No date: Family history of adverse reaction to anesthes*     Comment: MOMS BP DROPPED AND WAS SENT TO CCU No date: GERD (gastroesophageal reflux disease) No date: Sleep apnea     Comment: CPAP   Reproductive/Obstetrics negative OB ROS                             Anesthesia Physical Anesthesia Plan  ASA: III  Anesthesia Plan: General   Post-op Pain Management:    Induction:   Airway Management Planned:   Additional Equipment:   Intra-op Plan:   Post-operative Plan:   Informed Consent: I have reviewed the patients History and Physical, chart, labs and discussed the procedure including the risks, benefits and alternatives for the proposed anesthesia with the patient or authorized representative who has indicated his/her understanding and acceptance.   Dental Advisory Given  Plan Discussed with: Anesthesiologist, CRNA and Surgeon  Anesthesia Plan Comments:         Anesthesia Quick Evaluation

## 2016-05-06 LAB — SURGICAL PATHOLOGY

## 2016-05-10 LAB — AEROBIC/ANAEROBIC CULTURE W GRAM STAIN (SURGICAL/DEEP WOUND): Culture: NORMAL

## 2016-12-16 ENCOUNTER — Encounter: Payer: Self-pay | Admitting: Emergency Medicine

## 2016-12-16 ENCOUNTER — Emergency Department: Payer: BLUE CROSS/BLUE SHIELD

## 2016-12-16 ENCOUNTER — Emergency Department
Admission: EM | Admit: 2016-12-16 | Discharge: 2016-12-16 | Disposition: A | Discharge status: Home / Self Care | Payer: BLUE CROSS/BLUE SHIELD | Attending: Emergency Medicine | Admitting: Emergency Medicine

## 2016-12-16 ENCOUNTER — Ambulatory Visit
Admission: EM | Admit: 2016-12-16 | Discharge: 2016-12-16 | Disposition: A | Discharge status: Home / Self Care | Payer: BLUE CROSS/BLUE SHIELD | Attending: Family Medicine | Admitting: Family Medicine

## 2016-12-16 DIAGNOSIS — Z79899 Other long term (current) drug therapy: Secondary | ICD-10-CM | POA: Insufficient documentation

## 2016-12-16 DIAGNOSIS — R1030 Lower abdominal pain, unspecified: Secondary | ICD-10-CM | POA: Insufficient documentation

## 2016-12-16 DIAGNOSIS — R109 Unspecified abdominal pain: Secondary | ICD-10-CM | POA: Diagnosis present

## 2016-12-16 DIAGNOSIS — Z87891 Personal history of nicotine dependence: Secondary | ICD-10-CM | POA: Insufficient documentation

## 2016-12-16 DIAGNOSIS — R1032 Left lower quadrant pain: Secondary | ICD-10-CM

## 2016-12-16 LAB — URINALYSIS, COMPLETE (UACMP) WITH MICROSCOPIC
BACTERIA UA: NONE SEEN
BILIRUBIN URINE: NEGATIVE
Glucose, UA: NEGATIVE mg/dL
Hgb urine dipstick: NEGATIVE
KETONES UR: NEGATIVE mg/dL
LEUKOCYTES UA: NEGATIVE
NITRITE: NEGATIVE
PH: 7 (ref 5.0–8.0)
Protein, ur: NEGATIVE mg/dL
SPECIFIC GRAVITY, URINE: 1.013 (ref 1.005–1.030)

## 2016-12-16 LAB — COMPREHENSIVE METABOLIC PANEL
ALBUMIN: 4.4 g/dL (ref 3.5–5.0)
ALT: 21 U/L (ref 17–63)
ANION GAP: 10 (ref 5–15)
AST: 22 U/L (ref 15–41)
Alkaline Phosphatase: 87 U/L (ref 38–126)
BUN: 9 mg/dL (ref 6–20)
CHLORIDE: 103 mmol/L (ref 101–111)
CO2: 26 mmol/L (ref 22–32)
Calcium: 9.2 mg/dL (ref 8.9–10.3)
Creatinine, Ser: 0.85 mg/dL (ref 0.61–1.24)
GFR calc Af Amer: >60 mL/min (ref >60)
GFR calc non Af Amer: >60 mL/min (ref >60)
GLUCOSE: 101 mg/dL — AB (ref 65–99)
POTASSIUM: 4.3 mmol/L (ref 3.5–5.1)
SODIUM: 139 mmol/L (ref 135–145)
Total Bilirubin: 0.9 mg/dL (ref 0.3–1.2)
Total Protein: 8.5 g/dL — ABNORMAL HIGH (ref 6.5–8.1)

## 2016-12-16 LAB — CBC
HEMATOCRIT: 43.4 % (ref 40.0–52.0)
HEMOGLOBIN: 14.7 g/dL (ref 13.0–18.0)
MCH: 28.7 pg (ref 26.0–34.0)
MCHC: 33.9 g/dL (ref 32.0–36.0)
MCV: 84.9 fL (ref 80.0–100.0)
Platelets: 289 10*3/uL (ref 150–440)
RBC: 5.12 MIL/uL (ref 4.40–5.90)
RDW: 14.9 % — AB (ref 11.5–14.5)
WBC: 8.1 10*3/uL (ref 3.8–10.6)

## 2016-12-16 LAB — LIPASE, BLOOD: LIPASE: 22 U/L (ref 11–51)

## 2016-12-16 MED ORDER — IOPAMIDOL (ISOVUE-300) INJECTION 61%
100.0000 mL | Freq: Once | INTRAVENOUS | Status: AC | PRN
  Administered 2016-12-16: 100 mL via INTRAVENOUS

## 2016-12-16 MED ORDER — IOPAMIDOL (ISOVUE-300) INJECTION 61%
30.0000 mL | Freq: Once | INTRAVENOUS | Status: AC | PRN
  Administered 2016-12-16: 30 mL via ORAL

## 2016-12-16 NOTE — Discharge Instructions (Signed)
Go to ER for further evaluation .  

## 2016-12-16 NOTE — ED Provider Notes (Signed)
[facility] Emergency Department Provider Note   ____________________________________________   First MD Initiated Contact with Patient 12/16/16 1301     (approximate)  I have reviewed the triage vital signs and the nursing notes.   HISTORY  Chief Complaint Abdominal Pain; Nausea; and Emesis   HPI Jeremy [name] is a 41 y.o. male Reports he had abdominal pain nausea and vomiting. His not had any nausea vomiting since last night. He has not ate or drank anything either. He is going to start drinking clear liquids and then progress to regular food. He had a perforated bowel from diverticulosis a few years ago and he was very nervous. His doctor sent him here to be checked. He's not been running a fever.   Past Medical History:  Diagnosis Date  . Family history of adverse reaction to anesthesia    MOMS BP DROPPED AND WAS SENT TO CCU  . GERD (gastroesophageal reflux disease)   . Sleep apnea    CPAP    There are no active problems to display for this patient.   Past Surgical History:  Procedure Laterality Date  . ACHILLES TENDON SURGERY Left 2011   screws  . COLON SURGERY  2013   PERFORATED INTESTINE  . ETHMOIDECTOMY Left 05/05/2016   Procedure: ETHMOIDECTOMY;  Surgeon: Vernie Murders, MD;  Location: [facility] ORS;  Service: ENT;  Laterality: Left;  . FRONTAL SINUS EXPLORATION Left 05/05/2016   Procedure: FRONTAL SINUS EXPLORATION;  Surgeon: Vernie Murders, MD;  Location: [facility] ORS;  Service: ENT;  Laterality: Left;  . HERNIA REPAIR     AS A BABY  . IMAGE GUIDED SINUS SURGERY N/A 05/05/2016   Procedure: IMAGE GUIDED SINUS SURGERY;  Surgeon: Vernie Murders, MD;  Location: [facility] ORS;  Service: ENT;  Laterality: N/A;  . MAXILLARY ANTROSTOMY Bilateral 05/05/2016   Procedure: MAXILLARY ANTROSTOMY;  Surgeon: Vernie Murders, MD;  Location: [facility] ORS;  Service: ENT;  Laterality: Bilateral;  . NASAL SEPTUM SURGERY  2009    Prior to Admission medications   Medication Sig  Start Date End Date Taking? Authorizing Provider  acetaminophen (TYLENOL) 325 MG tablet Take 650 mg by mouth every 6 (six) hours as needed.    [provider]  amoxicillin-clavulanate (AUGMENTIN) 875-125 MG tablet Take 1 tablet by mouth 2 (two) times daily. 14 day Supply - Starting 04/28/16 04/28/16   [provider]  fluticasone (FLONASE) 50 MCG/ACT nasal spray Place 2 sprays into both nostrils daily. 05/29/15 12/16/16  Renford Dills, NP  ibuprofen (ADVIL,MOTRIN) 200 MG tablet Take 200 mg by mouth every 6 (six) hours as needed.    [provider]  omeprazole (PRILOSEC) 40 MG capsule Take 40 mg by mouth at bedtime.     [provider]    Allergies Patient has no known allergies.  No family history on file.  Social History Social History  Substance Use Topics  . Smoking status: Former Smoker    Packs/day: 1.00    Years: 13.00    Types: Cigarettes    Quit date: 04/30/2007  . Smokeless tobacco: Current User    Types: Chew  . Alcohol use Yes     Comment: RARE    Review of Systems  Constitutional: No fever/chills Eyes: No visual changes. ENT: No sore throat. Cardiovascular: Denies chest pain. Respiratory: Denies shortness of breath. Gastrointestinal: see history of present illness Genitourinary: Negative for dysuria. Musculoskeletal: Negative for back pain. Skin: Negative for rash. Neurological: Negative for headaches, focal weakness  ____________________________________________   PHYSICAL EXAM:  VITAL SIGNS: ED Triage Vitals  Enc Vitals Group     BP 12/16/16 0954 (!) 143/89     Pulse Rate 12/16/16 0954 67     Resp 12/16/16 0954 20     Temp 12/16/16 0954 98.3 F (36.8 C)     Temp Source 12/16/16 0954 Oral     SpO2 12/16/16 0954 100 %     Weight 12/16/16 0955 (!) 304 lb (137.9 kg)     Height 12/16/16 0955  (1.803 m)     Head Circumference --      Peak Flow --      Pain Score 12/16/16 1257 4     Pain Loc --      Pain Edu?  --      Excl. in GC? --     Constitutional: Alert and oriented. Well appearing and in no acute distress. Eyes: Conjunctivae are normal.I. Head: Atraumatic. Nose: No congestion/rhinnorhea. Mouth/Throat: Mucous membranes are moist.  Oropharynx non-erythematous. Neck: No stridor.  Cardiovascular: Normal rate, regular rhythm. Grossly normal heart sounds.  Good peripheral circulation. Respiratory: Normal respiratory effort.  No retractions. Lungs CTAB. Gastrointestinal: Soft patient reports mild left lower quadrant tenderness to palpation only No distention. No abdominal bruits. No CVA tenderness. Musculoskeletal: No lower extremity tenderness nor edema.  No joint effusions. Neurologic:  Normal speech and language. No gross focal neurologic deficits are appreciated. No gait instability. Skin:  Skin is warm, dry and intact. No rash noted. Psychiatric: Mood and affect are normal. Speech and behavior are normal.  ____________________________________________   LABS (all labs ordered are listed, but only abnormal results are displayed)  Labs Reviewed  COMPREHENSIVE METABOLIC PANEL - Abnormal; Notable for the following:       Result Value   Glucose, Bld 101 (*)    Total Protein 8.5 (*)    All other components within normal limits  CBC - Abnormal; Notable for the following:    RDW 14.9 (*)    All other components within normal limits  URINALYSIS, COMPLETE (UACMP) WITH MICROSCOPIC - Abnormal; Notable for the following:    Color, Urine YELLOW (*)    APPearance CLEAR (*)    Squamous Epithelial / LPF 0-5 (*)    All other components within normal limits  LIPASE, BLOOD   ____________________________________________  EKG  ____________________________________________  RADIOLOGY  Ct Abdomen Pelvis W Contrast  Result Date: 12/16/2016 CLINICAL DATA:  41 year old male with several days of abdominal pain. Left lower quadrant and central pain with nausea and vomiting. Prior diverticulitis.  EXAM: CT ABDOMEN AND PELVIS WITH CONTRAST TECHNIQUE: Multidetector CT imaging of the abdomen and pelvis was performed using the standard protocol following bolus administration of intravenous contrast. CONTRAST:  ISOVUE-300 IOPAMIDOL (ISOVUE-300) INJECTION 61% COMPARISON:  CT Abdomen and Pelvis 12/16/2011. FINDINGS: Lower chest: Negative.  No pericardial or pleural effusion. Hepatobiliary: Chronic hepatic steatosis suspected. Otherwise negative. Pancreas: Negative. Spleen: Negative. Adrenals/Urinary Tract: Normal adrenal glands. Bilateral renal enhancement and contrast excretion is symmetric and within normal limits. 2-3 mm right renal midpole calculus. No left nephrolithiasis. Negative course of both ureters. Unremarkable urinary bladder. Stomach/Bowel: Negative rectum. Mild retained stool in the rectosigmoid colon. Diverticulosis in the proximal sigmoid colon but no active inflammation. Diverticulosis continues in the left colon, but no active inflammation. Negative transverse colon aside from retained stool. Negative right colon and appendix. Negative terminal ileum. Oral contrast has just reached the cecum. No dilated or abnormal small bowel.  Negative stomach  and duodenum. No abdominal free fluid or free air. Vascular/Lymphatic: Major arterial structures in the abdomen and pelvis appear patent. Portal venous system appears patent. No lymphadenopathy. Reproductive: Mild prostate hypertrophy and dystrophic calcifications appear similar to 2013. Other: No pelvic free fluid. Musculoskeletal: No acute osseous abnormality identified. IMPRESSION: 1. No acute or inflammatory process in the abdomen or pelvis. Descending and sigmoid colon diverticulosis without active inflammation. Normal appendix. 2. Mild right nephrolithiasis.  No obstructive uropathy. 3. Chronic hepatic steatosis. Electronically Signed   By: Odessa Fleming M.D.   On: 12/16/2016 11:57   CT shows no evidence of diverticulitis or any other real cause of  his pain ____________________________________________   PROCEDURES  Procedure(s) performed:   Procedures  Critical Care performed:   ____________________________________________   INITIAL IMPRESSION / ASSESSMENT AND PLAN / ED COURSE  As part of my medical decision making, I reviewed the following data within the electronic MEDICAL RECORD NUMBER    review of his old records show he had a perforated small intestine not diverticulitisin the past      ____________________________________________   FINAL CLINICAL IMPRESSION(S) / ED DIAGNOSES  Final diagnoses:  Lower abdominal pain      NEW MEDICATIONS STARTED DURING THIS VISIT:  New Prescriptions   No medications on file     Note:  This document was prepared using Dragon voice recognition software and may include unintentional dictation errors.    Arnaldo Natal, MD 12/16/16 1434

## 2016-12-16 NOTE — ED Notes (Signed)
AAOx3.  Skin warm and dry.  NAD 

## 2016-12-16 NOTE — ED Provider Notes (Signed)
[facility] ____________________________________________  Time seen: Approximately 9:00 AM  I have reviewed the triage vital signs and the nursing notes.   HISTORY  Chief Complaint Abdominal Pain   HPI Jeremy [name] is a 41 y.o. male present for evaluation of abdominal pain present for approximately 1 week, gradually worsening, and acutely worsening yesterday. Patient reports pain comes and very strong stabbing and cramping wheezes. Patient states pain stays in lower abdomen, primarily left lower quadrant. Patient states he does have a history of diverticulitis with associated perforation, and states that this feels very similar to previous. States that was 5 years ago. States no surgical intervention was performed, but stayed nothing by mouth and had 4 days of IV antibiotics inpatient. Patient reports pain and symptoms are worsened after eating, but are not depended upon eating. Reports increased loose stools, but states not diarrhea. Reports accompanying nausea, no vomiting. States continued to drink fluids well, somewhat decreased and food intake with decreased appetite. States he has been having chills, but denies known fevers. Denies alleviating measures. Denies any increasing chest pain, shortness of breath, dizziness, rash or recent sickness. Patient states that he has not had any other abdominal issues similar to this other than that perforated diverticulitis. Denies recent sickness. Denies recent antibiotic use.    Past Medical History:  Diagnosis Date  . Family history of adverse reaction to anesthesia    MOMS BP DROPPED AND WAS SENT TO CCU  . GERD (gastroesophageal reflux disease)   . Sleep apnea    CPAP    There are no active problems to display for this patient.   Past Surgical History:  Procedure Laterality Date  . ACHILLES TENDON SURGERY Left 2011   screws  . COLON SURGERY  2013   PERFORATED INTESTINE  . ETHMOIDECTOMY Left 05/05/2016   Procedure:  ETHMOIDECTOMY;  Surgeon: Vernie Murders, MD;  Location: [facility] ORS;  Service: ENT;  Laterality: Left;  . FRONTAL SINUS EXPLORATION Left 05/05/2016   Procedure: FRONTAL SINUS EXPLORATION;  Surgeon: Vernie Murders, MD;  Location: [facility] OR[facility];  Service: ENT;  Laterality: Left;  . HERNIA REPAIR     AS A BABY  . IMAGE GUIDED SINUS SURGERY N/A 05/05/2016   Procedure: IMAGE GUIDED SINUS SURGERY;  Surgeon: Vernie Murders, MD;  Location: [facility] OR[facility];  Service: ENT;  Laterality: N/A;  . MAXILLARY ANTROSTOMY Bilateral 05/05/2016   Procedure: MAXILLARY ANTROSTOMY;  Surgeon: Vernie Murders, MD;  Location: [facility] OR[facility];  Service: ENT;  Laterality: Bilateral;  . NASAL SEPTUM SURGERY  2009     No current facility-administered medications for this encounter.   Current Outpatient Prescriptions:  .  acetaminophen (TYLENOL) 325 MG tablet, Take 650 mg by mouth every 6 (six) hours as needed., Disp: , Rfl:  .  fluticasone (FLONASE) 50 MCG/ACT nasal spray, Place 2 sprays into both nostrils daily., Disp: 1 g, Rfl: 0 .  ibuprofen (ADVIL,MOTRIN) 200 MG tablet, Take 200 mg by mouth every 6 (six) hours as needed., Disp: , Rfl:  .  omeprazole (PRILOSEC) 40 MG capsule, Take 40 mg by mouth at bedtime. , Disp: , Rfl:  .  amoxicillin-clavulanate (AUGMENTIN) 875-125 MG tablet, Take 1 tablet by mouth 2 (two) times daily. 14 day Supply - Starting 04/28/16, Disp: , Rfl: 0  Allergies Patient has no known allergies.  History reviewed. No pertinent family history.  Social History Social History  Substance Use Topics  . Smoking status: Former Smoker    Packs/day: 1.00    Years: 13.00  Types: Cigarettes    Quit date: 04/30/2007  . Smokeless tobacco: Current User    Types: Chew  . Alcohol use Yes     Comment: RARE    Review of Systems Constitutional: Positive chills.  Cardiovascular: Denies chest pain. Respiratory: Denies shortness of breath. Gastrointestinal:AS above. No constipation. Genitourinary: Negative for  dysuria. Musculoskeletal: Negative for back pain. Skin: Negative for rash.   ____________________________________________   PHYSICAL EXAM:  VITAL SIGNS: ED Triage Vitals  Enc Vitals Group     BP 12/16/16 0841 134/80     Pulse Rate 12/16/16 0841 61     Resp 12/16/16 0841 18     Temp 12/16/16 0841 98.9 F (37.2 C)     Temp Source 12/16/16 0841 Oral     SpO2 12/16/16 0841 100 %     Weight 12/16/16 0838 (!) 304 lb (137.9 kg)     Height 12/16/16 0838  (1.803 m)     Head Circumference --      Peak Flow --      Pain Score 12/16/16 0838 8     Pain Loc --      Pain Edu? --      Excl. in GC? --     Constitutional: Alert and oriented. Well appearing and in no acute distress. Cardiovascular: Normal rate, regular rhythm. Grossly normal heart sounds.  Good peripheral circulation. Respiratory: Normal respiratory effort without tachypnea nor retractions. Breath sounds are clear and equal bilaterally. No wheezes, rales, rhonchi. Gastrointestinal: Mildly hypoactive bowel sounds. Obese abdomen. Mild left lower and suprapubic abdominal discomfort, abdomen otherwise soft and nontender. Non-guarding. No CVA tenderness. Musculoskeletal:  Steady gait. No midline cervical, thoracic or lumbar tenderness to palpation.  Neurologic:  Normal speech and language. Speech is normal. No gait instability.  Skin:  Skin is warm, dry and intact. No rash noted. Psychiatric: Mood and affect are normal. Speech and behavior are normal. Patient exhibits appropriate insight and judgment   ___________________________________________   LABS (all labs ordered are listed, but only abnormal results are displayed)  Labs Reviewed - No data to display ____________________________________________  PROCEDURES Procedures    INITIAL IMPRESSION / ASSESSMENT AND PLAN / ED COURSE  Pertinent labs & imaging results that were available during my care of the patient were reviewed by me and considered in my medical  decision making (see chart for details).  Well-appearing. Patient presenting for evaluation of abdominal pain. Patient with previous history of diverticulitis with perforation with previous inpatient hospitalization, no surgery. Presenting for abdominal pain which he describes as very similar. Patient states pain comes in waves and very sharp and cramping. Suspect diverticulitis. However as patient with no other flares of diverticulitis, other than diverticulitis with perforation, recommend for patient to proceed to the emergency room for further evaluation of lab studies and CT imaging with contrast. Discussed evaluating labs in urgent care, however would still recommend to proceed to ER. Discussed this in detail with patient, patient verbalized understanding. Patient states that he was going to call his wife to further discuss, I left room. Melissa CMA reported that patient states that he is not sure if he will go to the ER or not but will call his primary. Patient alert and oriented with decisional capacity, stable at time of discharge.   ____________________________________________   FINAL CLINICAL IMPRESSION(S) / ED DIAGNOSES  Final diagnoses:  Left lower quadrant pain     Discharge Medication List as of 12/16/2016  9:02 AM  Note: This dictation was prepared with Dragon dictation along with smaller phrase technology. Any transcriptional errors that result from this process are unintentional.         Renford Dills, NP 12/16/16 [phone]

## 2016-12-16 NOTE — ED Triage Notes (Signed)
Pt reports started with abdominal pain a couple of days ago. Pt reports intermittent cramping to left lower quadrant and center. Pt reports some NV as well. Pt reports hx of diverticulitis and bowel perf, sttaes he went to his MD today and they made him come to ED.

## 2016-12-16 NOTE — Discharge Instructions (Signed)
please return for worse pain fever or vomiting. If you want to try going back to regular foods slowly starting with clear liquids and then a bland diet and regular food in a day or 2 that is fine. Please follow-up with your doctor. And again return here if you get worse.

## 2016-12-16 NOTE — ED Provider Notes (Signed)
ADDENDUM: 12/16/16 1000  Reviewed patient's chart, noted that he was an [facility] ED waiting room. Called and report given to [facility] RN triage nurse.   Renford Dills, NP 12/16/16 1000

## 2016-12-16 NOTE — ED Triage Notes (Signed)
Patient complains of abdominal pain. Patient states that he has a history of perforated intestines. Patient states that his symptoms have mimicked his previous symptoms. Patient reports that symptoms started one week ago and pain worsened yesterday. Patient reports nausea, sharp pains in mid abdomen, chills. Patient denies any diarrhea or fatigue. Patient states that pain is worse with eating.

## 2017-08-14 ENCOUNTER — Ambulatory Visit (INDEPENDENT_AMBULATORY_CARE_PROVIDER_SITE_OTHER): Payer: BLUE CROSS/BLUE SHIELD

## 2017-08-14 ENCOUNTER — Other Ambulatory Visit: Payer: Self-pay

## 2017-08-14 ENCOUNTER — Ambulatory Visit
Admission: EM | Admit: 2017-08-14 | Discharge: 2017-08-14 | Disposition: A | Discharge status: Home / Self Care | Payer: BLUE CROSS/BLUE SHIELD | Attending: Family Medicine | Admitting: Family Medicine

## 2017-08-14 DIAGNOSIS — W19XXXA Unspecified fall, initial encounter: Secondary | ICD-10-CM | POA: Diagnosis not present

## 2017-08-14 DIAGNOSIS — M25562 Pain in left knee: Secondary | ICD-10-CM

## 2017-08-14 DIAGNOSIS — S6992XA Unspecified injury of left wrist, hand and finger(s), initial encounter: Secondary | ICD-10-CM

## 2017-08-14 DIAGNOSIS — M25532 Pain in left wrist: Secondary | ICD-10-CM

## 2017-08-14 MED ORDER — MELOXICAM 15 MG PO TABS: 15.0000 mg | ORAL_TABLET | Freq: Every day | ORAL | 0 refills | Status: DC | PRN

## 2017-08-14 NOTE — Discharge Instructions (Signed)
Rest, ice, elevation.  Medication as needed.  Take care  Dr. Tracen Mahler  

## 2017-08-14 NOTE — ED Provider Notes (Signed)
[facility]    CSN: [id number] Arrival date & time: 08/14/17  1822  History   Chief Complaint Chief Complaint  Patient presents with  . Wrist Pain    left   . Knee Pain    left   HPI  42 year old male presents with the above complaints.  Patient states that he was going to his daughter's softball game.  He slipped and fell on concrete.  He states that he is unsure if he fell forward or backward.  He states that he fell injuring his left knee particularly at the kneecap.  He also injured his left wrist as he tried to brace himself.  He reports the wrist pain is medial.  Worse with range of motion.  Decreased range of motion.  Patient reports decreased range of motion of the knee.  Pain is moderate in severity.  No other reported symptoms.  No other complaints concerns this time.  Past Medical History:  Diagnosis Date  . Family history of adverse reaction to anesthesia    MOMS BP DROPPED AND WAS SENT TO CCU  . GERD (gastroesophageal reflux disease)   . Sleep apnea    CPAP   Past Surgical History:  Procedure Laterality Date  . ACHILLES TENDON SURGERY Left 2011   screws  . COLON SURGERY  2013   PERFORATED INTESTINE  . ETHMOIDECTOMY Left 05/05/2016   Procedure: ETHMOIDECTOMY;  Surgeon: Vernie Murders, MD;  Location: [facility];  Service: ENT;  Laterality: Left;  . FRONTAL SINUS EXPLORATION Left 05/05/2016   Procedure: FRONTAL SINUS EXPLORATION;  Surgeon: Vernie Murders, MD;  Location: [facility];  Service: ENT;  Laterality: Left;  . HERNIA REPAIR     AS A BABY  . IMAGE GUIDED SINUS SURGERY N/A 05/05/2016   Procedure: IMAGE GUIDED SINUS SURGERY;  Surgeon: Vernie Murders, MD;  Location: [facility];  Service: ENT;  Laterality: N/A;  . MAXILLARY ANTROSTOMY Bilateral 05/05/2016   Procedure: MAXILLARY ANTROSTOMY;  Surgeon: Vernie Murders, MD;  Location: [facility];  Service: ENT;  Laterality: Bilateral;  . NASAL SEPTUM SURGERY  2009   Home Medications    Prior to Admission medications     Medication Sig Start Date End Date Taking? Authorizing Provider  omeprazole (PRILOSEC) 40 MG capsule Take 40 mg by mouth at bedtime.    Yes [provider]  fluticasone (FLONASE) 50 MCG/ACT nasal spray Place 2 sprays into both nostrils daily. 05/29/15 12/16/16  Renford Dills, NP  meloxicam (MOBIC) 15 MG tablet Take 1 tablet (15 mg total) by mouth daily as needed. 08/14/17   Tommie Sams, DO   Social History Social History   Tobacco Use  . Smoking status: Former Smoker    Packs/day: 1.00    Years: 13.00    Pack years: 13.00    Types: Cigarettes    Last attempt to quit: 04/30/2007    Years since quitting: 10.2  . Smokeless tobacco: Current User    Types: Chew  Substance Use Topics  . Alcohol use: Yes    Comment: RARE  . Drug use: No    Allergies   Patient has no known allergies.  Review of Systems Review of Systems  Constitutional: Negative.   Musculoskeletal:       Left knee pain, left wrist pain.   Physical Exam Triage Vital Signs ED Triage Vitals [08/14/17 1836]  Enc Vitals Group     BP (!) 151/83     Pulse Rate 82     Resp 18  Temp 98.3 F (36.8 C)     Temp Source Oral     SpO2 100 %     Weight (!) 303 lb (137.4 kg)     Height 5\' 11"  (1.803 m)     Head Circumference      Peak Flow      Pain Score 7     Pain Loc      Pain Edu?      Excl. in GC?    Updated Vital Signs BP (!) 151/83 (BP Location: Left Arm)   Pulse 82   Temp 98.3 F (36.8 C) (Oral)   Resp 18   Ht 5\' 11"  (1.803 m)   Wt (!) 303 lb (137.4 kg)   SpO2 100%   BMI 42.26 kg/m    Physical Exam  Constitutional: He is oriented to person, place, and time. He appears well-developed. No distress.  HENT:  Head: Normocephalic and atraumatic.  Pulmonary/Chest: Effort normal. No respiratory distress.  Musculoskeletal:  Left knee -patient with tenderness of the kneecap.  No joint line tenderness.  Decreased range of motion secondary to pain.  Ligaments intact.  No effusion.  Left wrist  -patient with tenderness medially.  No  snuffbox tenderness.  Decreased range of motion in flexion and extension.  Neurological: He is alert and oriented to person, place, and time.  Psychiatric: He has a normal mood and affect. His behavior is normal.  Nursing note and vitals reviewed.  UC Treatments / Results  Labs (all labs ordered are listed, but only abnormal results are displayed) Labs Reviewed - No data to display  EKG None  Radiology Dg Wrist Complete Left  Result Date: 08/14/2017 CLINICAL DATA:  Status post fall complaining of swelling of lateral left wrist. EXAM: LEFT WRIST - COMPLETE 3+ VIEW COMPARISON:  None. FINDINGS: There is no evidence of fracture or dislocation. There is no evidence of arthropathy or other focal bone abnormality. Soft tissues are unremarkable. IMPRESSION: Negative. Electronically Signed   By: Sherian Rein M.D.   On: 08/14/2017 19:09   Dg Knee Complete 4 Views Left  Result Date: 08/14/2017 CLINICAL DATA:  Status post fall with pain of left knee. EXAM: LEFT KNEE - COMPLETE 4+ VIEW COMPARISON:  None. FINDINGS: No evidence of fracture, dislocation. There is a small suprapatellar effusion. Soft tissues are unremarkable. IMPRESSION: No acute fracture or dislocation. Electronically Signed   By: Sherian Rein M.D.   On: 08/14/2017 19:10    Procedures Procedures (including critical care time)  Medications Ordered in UC Medications - No data to display  Initial Impression / Assessment and Plan / UC Course  I have reviewed the triage vital signs and the nursing notes.  Pertinent labs & imaging results that were available during my care of the patient were reviewed by me and considered in my medical decision making (see chart for details).    42 year old male presents with wrist pain and knee pain after suffering a fall.  X-rays negative.  Treated with Mobic.  Rest, ice, elevation.  Final Clinical Impressions(s) / UC Diagnoses   Final diagnoses:  Injury of  left wrist, initial encounter  Acute pain of left knee     Discharge Instructions     Rest, ice, elevation.  Medication as needed.  Take care  Dr. Adriana Simas    ED Prescriptions    Medication Sig Dispense Auth. Provider   meloxicam (MOBIC) 15 MG tablet Take 1 tablet (15 mg total) by mouth daily as needed. 30 tablet  Tommie Samsook, Carleigh Buccieri G, DO     Controlled Substance Prescriptions [place] Controlled Substance Registry consulted? Not Applicable  Tommie SamsCook, Tanaya Dunigan G, DO 08/14/17 2004

## 2017-08-14 NOTE — ED Triage Notes (Signed)
Patient complains of a fall that occurred around 520pm. Patient states that he was walking to daughters softball game and fell, landed on left knee-painful mostly at knee cap, and tried to catch himself with left wrist. Patient is unable to move his left wrist.

## 2018-05-21 ENCOUNTER — Ambulatory Visit: Admit: 2018-05-21 | Payer: BLUE CROSS/BLUE SHIELD | Admitting: Unknown Physician Specialty

## 2018-05-21 SURGERY — EGD (ESOPHAGOGASTRODUODENOSCOPY)
Anesthesia: General

## 2018-07-30 ENCOUNTER — Ambulatory Visit: Admit: 2018-07-30 | Payer: BLUE CROSS/BLUE SHIELD | Admitting: Unknown Physician Specialty

## 2018-07-30 SURGERY — ESOPHAGOGASTRODUODENOSCOPY (EGD) WITH PROPOFOL
Anesthesia: General

## 2018-11-01 DIAGNOSIS — S92811A Other fracture of right foot, initial encounter for closed fracture: Secondary | ICD-10-CM | POA: Insufficient documentation

## 2019-02-06 DIAGNOSIS — K760 Fatty (change of) liver, not elsewhere classified: Secondary | ICD-10-CM | POA: Insufficient documentation

## 2019-03-24 DIAGNOSIS — Z6841 Body Mass Index (BMI) 40.0 and over, adult: Secondary | ICD-10-CM | POA: Insufficient documentation

## 2019-09-08 DIAGNOSIS — Z9884 Bariatric surgery status: Secondary | ICD-10-CM | POA: Insufficient documentation

## 2020-03-12 ENCOUNTER — Emergency Department: Payer: BC Managed Care – PPO

## 2020-03-12 ENCOUNTER — Encounter
Admission: EM | Disposition: A | Discharge status: Home / Self Care | Payer: Self-pay | Source: Home / Self Care | Attending: Surgery

## 2020-03-12 ENCOUNTER — Inpatient Hospital Stay: Payer: BC Managed Care – PPO

## 2020-03-12 ENCOUNTER — Inpatient Hospital Stay: Payer: BC Managed Care – PPO | Admitting: Certified Registered Nurse Anesthetist

## 2020-03-12 ENCOUNTER — Inpatient Hospital Stay
Admission: EM | Admit: 2020-03-12 | Discharge: 2020-03-15 | DRG: 331 | Disposition: A | Discharge status: Home / Self Care | Payer: BC Managed Care – PPO | Attending: Surgery | Admitting: Surgery

## 2020-03-12 ENCOUNTER — Other Ambulatory Visit: Payer: Self-pay

## 2020-03-12 ENCOUNTER — Encounter: Payer: Self-pay | Admitting: Radiology

## 2020-03-12 DIAGNOSIS — K275 Chronic or unspecified peptic ulcer, site unspecified, with perforation: Secondary | ICD-10-CM | POA: Diagnosis present

## 2020-03-12 DIAGNOSIS — Z791 Long term (current) use of non-steroidal anti-inflammatories (NSAID): Secondary | ICD-10-CM

## 2020-03-12 DIAGNOSIS — K828 Other specified diseases of gallbladder: Secondary | ICD-10-CM | POA: Diagnosis present

## 2020-03-12 DIAGNOSIS — Z79899 Other long term (current) drug therapy: Secondary | ICD-10-CM

## 2020-03-12 DIAGNOSIS — Z6832 Body mass index (BMI) 32.0-32.9, adult: Secondary | ICD-10-CM | POA: Diagnosis not present

## 2020-03-12 DIAGNOSIS — R198 Other specified symptoms and signs involving the digestive system and abdomen: Secondary | ICD-10-CM | POA: Diagnosis not present

## 2020-03-12 DIAGNOSIS — Z8719 Personal history of other diseases of the digestive system: Secondary | ICD-10-CM

## 2020-03-12 DIAGNOSIS — R109 Unspecified abdominal pain: Secondary | ICD-10-CM | POA: Diagnosis not present

## 2020-03-12 DIAGNOSIS — E669 Obesity, unspecified: Secondary | ICD-10-CM | POA: Diagnosis present

## 2020-03-12 DIAGNOSIS — Z9289 Personal history of other medical treatment: Secondary | ICD-10-CM | POA: Diagnosis not present

## 2020-03-12 DIAGNOSIS — Z9884 Bariatric surgery status: Secondary | ICD-10-CM

## 2020-03-12 DIAGNOSIS — Z20822 Contact with and (suspected) exposure to covid-19: Secondary | ICD-10-CM | POA: Diagnosis present

## 2020-03-12 DIAGNOSIS — K285 Chronic or unspecified gastrojejunal ulcer with perforation: Secondary | ICD-10-CM | POA: Diagnosis present

## 2020-03-12 DIAGNOSIS — K219 Gastro-esophageal reflux disease without esophagitis: Secondary | ICD-10-CM | POA: Diagnosis present

## 2020-03-12 DIAGNOSIS — Z978 Presence of other specified devices: Secondary | ICD-10-CM

## 2020-03-12 DIAGNOSIS — Z87891 Personal history of nicotine dependence: Secondary | ICD-10-CM | POA: Diagnosis not present

## 2020-03-12 DIAGNOSIS — K631 Perforation of intestine (nontraumatic): Secondary | ICD-10-CM

## 2020-03-12 DIAGNOSIS — G4730 Sleep apnea, unspecified: Secondary | ICD-10-CM | POA: Diagnosis present

## 2020-03-12 HISTORY — PX: XI ROBOT ASSISTED DIAGNOSTIC LAPAROSCOPY: SHX6815

## 2020-03-12 LAB — CBC
HCT: 47.1 % (ref 39.0–52.0)
Hemoglobin: 15.5 g/dL (ref 13.0–17.0)
MCH: 28.9 pg (ref 26.0–34.0)
MCHC: 32.9 g/dL (ref 30.0–36.0)
MCV: 87.7 fL (ref 80.0–100.0)
Platelets: 319 10*3/uL (ref 150–400)
RBC: 5.37 MIL/uL (ref 4.22–5.81)
RDW: 15 % (ref 11.5–15.5)
WBC: 7.3 10*3/uL (ref 4.0–10.5)
nRBC: 0 % (ref 0.0–0.2)

## 2020-03-12 LAB — URINALYSIS, COMPLETE (UACMP) WITH MICROSCOPIC
Bacteria, UA: NONE SEEN
Bilirubin Urine: NEGATIVE
Glucose, UA: NEGATIVE mg/dL
Hgb urine dipstick: NEGATIVE
Ketones, ur: 5 mg/dL — AB
Leukocytes,Ua: NEGATIVE
Nitrite: NEGATIVE
Protein, ur: NEGATIVE mg/dL
Specific Gravity, Urine: 1.02 (ref 1.005–1.030)
Squamous Epithelial / LPF: NONE SEEN (ref 0–5)
pH: 6 (ref 5.0–8.0)

## 2020-03-12 LAB — COMPREHENSIVE METABOLIC PANEL
ALT: 18 U/L (ref 0–44)
AST: 23 U/L (ref 15–41)
Albumin: 3.9 g/dL (ref 3.5–5.0)
Alkaline Phosphatase: 96 U/L (ref 38–126)
Anion gap: 11 (ref 5–15)
BUN: 18 mg/dL (ref 6–20)
CO2: 24 mmol/L (ref 22–32)
Calcium: 8.7 mg/dL — ABNORMAL LOW (ref 8.9–10.3)
Chloride: 102 mmol/L (ref 98–111)
Creatinine, Ser: 0.73 mg/dL (ref 0.61–1.24)
GFR, Estimated: >60 mL/min (ref >60)
Glucose, Bld: 124 mg/dL — ABNORMAL HIGH (ref 70–99)
Potassium: 4.2 mmol/L (ref 3.5–5.1)
Sodium: 137 mmol/L (ref 135–145)
Total Bilirubin: 1 mg/dL (ref 0.3–1.2)
Total Protein: 7.3 g/dL (ref 6.5–8.1)

## 2020-03-12 LAB — LIPASE, BLOOD: Lipase: 29 U/L (ref 11–51)

## 2020-03-12 LAB — RESP PANEL BY RT-PCR (FLU A&B, COVID) ARPGX2
Influenza A by PCR: NEGATIVE
Influenza B by PCR: NEGATIVE
SARS Coronavirus 2 by RT PCR: NEGATIVE

## 2020-03-12 SURGERY — LAPAROSCOPY, DIAGNOSTIC, ROBOT-ASSISTED
Anesthesia: General

## 2020-03-12 MED ORDER — PROPOFOL 500 MG/50ML IV EMUL
INTRAVENOUS | Status: AC
  Filled 2020-03-12: qty 50

## 2020-03-12 MED ORDER — FAMOTIDINE 20 MG PO TABS: 20.0000 mg | ORAL_TABLET | Freq: Once | ORAL | Status: DC

## 2020-03-12 MED ORDER — DEXAMETHASONE SODIUM PHOSPHATE 10 MG/ML IJ SOLN
INTRAMUSCULAR | Status: DC | PRN
  Administered 2020-03-12: 10 mg via INTRAVENOUS

## 2020-03-12 MED ORDER — LACTATED RINGERS IV SOLN: INTRAVENOUS | Status: DC

## 2020-03-12 MED ORDER — SUGAMMADEX SODIUM 500 MG/5ML IV SOLN
INTRAVENOUS | Status: AC
  Filled 2020-03-12: qty 5

## 2020-03-12 MED ORDER — BUPIVACAINE LIPOSOME 1.3 % IJ SUSP
INTRAMUSCULAR | Status: DC | PRN
  Administered 2020-03-12: 20 mL

## 2020-03-12 MED ORDER — MORPHINE SULFATE (PF) 4 MG/ML IV SOLN
4.0000 mg | INTRAVENOUS | Status: DC | PRN
  Administered 2020-03-12 – 2020-03-13 (×2): 4 mg via INTRAVENOUS
  Filled 2020-03-12 (×2): qty 1

## 2020-03-12 MED ORDER — SODIUM CHLORIDE 0.9 % IV SOLN: INTRAVENOUS | Status: DC | PRN

## 2020-03-12 MED ORDER — PROPOFOL 10 MG/ML IV BOLUS
INTRAVENOUS | Status: DC | PRN
  Administered 2020-03-12: 200 mg via INTRAVENOUS

## 2020-03-12 MED ORDER — PANTOPRAZOLE SODIUM 40 MG IV SOLR
40.0000 mg | Freq: Two times a day (BID) | INTRAVENOUS | Status: DC
  Administered 2020-03-12 – 2020-03-15 (×6): 40 mg via INTRAVENOUS
  Filled 2020-03-12 (×6): qty 40

## 2020-03-12 MED ORDER — PROPOFOL 10 MG/ML IV BOLUS
INTRAVENOUS | Status: AC
  Filled 2020-03-12: qty 20

## 2020-03-12 MED ORDER — SODIUM CHLORIDE 0.9 % IV SOLN
1000.0000 mL | Freq: Once | INTRAVENOUS | Status: AC
  Administered 2020-03-12: 1000 mL via INTRAVENOUS

## 2020-03-12 MED ORDER — SODIUM CHLORIDE 0.9 % IV SOLN
INTRAVENOUS | Status: DC | PRN
  Administered 2020-03-12: 25 ug/min via INTRAVENOUS

## 2020-03-12 MED ORDER — LIDOCAINE HCL (CARDIAC) PF 100 MG/5ML IV SOSY
PREFILLED_SYRINGE | INTRAVENOUS | Status: DC | PRN
  Administered 2020-03-12: 100 mg via INTRAVENOUS

## 2020-03-12 MED ORDER — SODIUM CHLORIDE FLUSH 0.9 % IV SOLN
INTRAVENOUS | Status: AC
  Filled 2020-03-12: qty 50

## 2020-03-12 MED ORDER — FENTANYL CITRATE (PF) 100 MCG/2ML IJ SOLN: 25.0000 ug | INTRAMUSCULAR | Status: DC | PRN

## 2020-03-12 MED ORDER — SODIUM CHLORIDE (PF) 0.9 % IJ SOLN
INTRAMUSCULAR | Status: AC
  Filled 2020-03-12: qty 50

## 2020-03-12 MED ORDER — MORPHINE SULFATE (PF) 4 MG/ML IV SOLN
4.0000 mg | Freq: Once | INTRAVENOUS | Status: AC
  Administered 2020-03-12: 4 mg via INTRAVENOUS
  Filled 2020-03-12: qty 1

## 2020-03-12 MED ORDER — PIPERACILLIN-TAZOBACTAM 3.375 G IVPB
3.3750 g | Freq: Three times a day (TID) | INTRAVENOUS | Status: DC
  Administered 2020-03-12 – 2020-03-15 (×9): 3.375 g via INTRAVENOUS
  Filled 2020-03-12 (×8): qty 50

## 2020-03-12 MED ORDER — ONDANSETRON HCL 4 MG/2ML IJ SOLN
INTRAMUSCULAR | Status: DC | PRN
  Administered 2020-03-12: 4 mg via INTRAVENOUS

## 2020-03-12 MED ORDER — PANTOPRAZOLE SODIUM 40 MG IV SOLR
40.0000 mg | Freq: Every day | INTRAVENOUS | Status: DC
  Filled 2020-03-12: qty 40

## 2020-03-12 MED ORDER — BUPIVACAINE-EPINEPHRINE (PF) 0.25% -1:200000 IJ SOLN
INTRAMUSCULAR | Status: AC
  Filled 2020-03-12: qty 30

## 2020-03-12 MED ORDER — MIDAZOLAM HCL 2 MG/2ML IJ SOLN
INTRAMUSCULAR | Status: DC | PRN
  Administered 2020-03-12: 2 mg via INTRAVENOUS

## 2020-03-12 MED ORDER — ACETAMINOPHEN 10 MG/ML IV SOLN
INTRAVENOUS | Status: AC
  Filled 2020-03-12: qty 100

## 2020-03-12 MED ORDER — ACETAMINOPHEN 10 MG/ML IV SOLN
INTRAVENOUS | Status: DC | PRN
  Administered 2020-03-12: 1000 mg via INTRAVENOUS

## 2020-03-12 MED ORDER — MORPHINE SULFATE (PF) 2 MG/ML IV SOLN: 2.0000 mg | INTRAVENOUS | Status: DC | PRN

## 2020-03-12 MED ORDER — ONDANSETRON HCL 4 MG/2ML IJ SOLN
4.0000 mg | Freq: Once | INTRAMUSCULAR | Status: AC
  Administered 2020-03-12: 4 mg via INTRAVENOUS
  Filled 2020-03-12: qty 2

## 2020-03-12 MED ORDER — FENTANYL CITRATE (PF) 100 MCG/2ML IJ SOLN
INTRAMUSCULAR | Status: DC | PRN
  Administered 2020-03-12: 25 ug via INTRAVENOUS
  Administered 2020-03-12: 50 ug via INTRAVENOUS
  Administered 2020-03-12: 25 ug via INTRAVENOUS

## 2020-03-12 MED ORDER — IOHEXOL 300 MG/ML  SOLN
100.0000 mL | Freq: Once | INTRAMUSCULAR | Status: AC | PRN
  Administered 2020-03-12: 100 mL via INTRAVENOUS
  Filled 2020-03-12: qty 100

## 2020-03-12 MED ORDER — ONDANSETRON 4 MG PO TBDP: 4.0000 mg | ORAL_TABLET | Freq: Four times a day (QID) | ORAL | Status: DC | PRN

## 2020-03-12 MED ORDER — ONDANSETRON HCL 4 MG/2ML IJ SOLN
INTRAMUSCULAR | Status: AC
  Filled 2020-03-12: qty 2

## 2020-03-12 MED ORDER — ONDANSETRON HCL 4 MG/2ML IJ SOLN: 4.0000 mg | Freq: Once | INTRAMUSCULAR | Status: DC | PRN

## 2020-03-12 MED ORDER — FENTANYL CITRATE (PF) 100 MCG/2ML IJ SOLN
INTRAMUSCULAR | Status: AC
  Filled 2020-03-12: qty 2

## 2020-03-12 MED ORDER — ONDANSETRON HCL 4 MG/2ML IJ SOLN
4.0000 mg | Freq: Four times a day (QID) | INTRAMUSCULAR | Status: DC | PRN
  Administered 2020-03-12: 4 mg via INTRAVENOUS
  Filled 2020-03-12: qty 2

## 2020-03-12 MED ORDER — LIDOCAINE HCL (PF) 2 % IJ SOLN
INTRAMUSCULAR | Status: AC
  Filled 2020-03-12: qty 5

## 2020-03-12 MED ORDER — DEXAMETHASONE SODIUM PHOSPHATE 10 MG/ML IJ SOLN
INTRAMUSCULAR | Status: AC
  Filled 2020-03-12: qty 1

## 2020-03-12 MED ORDER — GLYCOPYRROLATE 0.2 MG/ML IJ SOLN
INTRAMUSCULAR | Status: DC | PRN
  Administered 2020-03-12: .2 mg via INTRAVENOUS

## 2020-03-12 MED ORDER — FENTANYL CITRATE (PF) 100 MCG/2ML IJ SOLN
50.0000 ug | INTRAMUSCULAR | Status: AC | PRN
  Administered 2020-03-12 (×2): 50 ug via INTRAVENOUS
  Filled 2020-03-12 (×2): qty 2

## 2020-03-12 MED ORDER — SUGAMMADEX SODIUM 500 MG/5ML IV SOLN
INTRAVENOUS | Status: DC | PRN
  Administered 2020-03-12: 220 mg via INTRAVENOUS

## 2020-03-12 MED ORDER — METHYLENE BLUE 0.5 % INJ SOLN
INTRAVENOUS | Status: AC
  Filled 2020-03-12: qty 10

## 2020-03-12 MED ORDER — EPHEDRINE SULFATE 50 MG/ML IJ SOLN
INTRAMUSCULAR | Status: DC | PRN
  Administered 2020-03-12: 10 mg via INTRAVENOUS

## 2020-03-12 MED ORDER — ROCURONIUM BROMIDE 10 MG/ML (PF) SYRINGE
PREFILLED_SYRINGE | INTRAVENOUS | Status: AC
  Filled 2020-03-12: qty 10

## 2020-03-12 MED ORDER — ROCURONIUM BROMIDE 100 MG/10ML IV SOLN
INTRAVENOUS | Status: DC | PRN
  Administered 2020-03-12: 20 mg via INTRAVENOUS
  Administered 2020-03-12: 60 mg via INTRAVENOUS

## 2020-03-12 MED ORDER — BUPIVACAINE LIPOSOME 1.3 % IJ SUSP
INTRAMUSCULAR | Status: AC
  Filled 2020-03-12: qty 20

## 2020-03-12 MED ORDER — BUPIVACAINE-EPINEPHRINE 0.25% -1:200000 IJ SOLN
INTRAMUSCULAR | Status: DC | PRN
  Administered 2020-03-12: 30 mL

## 2020-03-12 MED ORDER — MIDAZOLAM HCL 2 MG/2ML IJ SOLN
INTRAMUSCULAR | Status: AC
  Filled 2020-03-12: qty 2

## 2020-03-12 MED ORDER — PHENYLEPHRINE HCL (PRESSORS) 10 MG/ML IV SOLN
INTRAVENOUS | Status: DC | PRN
  Administered 2020-03-12: 100 ug via INTRAVENOUS
  Administered 2020-03-12: 200 ug via INTRAVENOUS

## 2020-03-12 MED ORDER — PIPERACILLIN-TAZOBACTAM 3.375 G IVPB
INTRAVENOUS | Status: AC
  Filled 2020-03-12: qty 50

## 2020-03-12 SURGICAL SUPPLY — 47 items
CANISTER SUCT 1200ML W/VALVE (MISCELLANEOUS) ×2 IMPLANT
CANNULA REDUC XI 12-8 STAPL (CANNULA)
CANNULA REDUCER 12-8 DVNC XI (CANNULA) IMPLANT
CHLORAPREP W/TINT 26 (MISCELLANEOUS) ×2 IMPLANT
COVER TIP SHEARS 8 DVNC (MISCELLANEOUS) ×1 IMPLANT
COVER TIP SHEARS 8MM DA VINCI (MISCELLANEOUS) ×1
COVER WAND RF STERILE (DRAPES) ×4 IMPLANT
DECANTER SPIKE VIAL GLASS SM (MISCELLANEOUS) ×2 IMPLANT
DEFOGGER SCOPE WARMER CLEARIFY (MISCELLANEOUS) ×2 IMPLANT
DERMABOND ADVANCED (GAUZE/BANDAGES/DRESSINGS) ×1
DERMABOND ADVANCED .7 DNX12 (GAUZE/BANDAGES/DRESSINGS) ×1 IMPLANT
DRAPE ARM DVNC X/XI (DISPOSABLE) ×4 IMPLANT
DRAPE COLUMN DVNC XI (DISPOSABLE) ×1 IMPLANT
DRAPE DA VINCI XI ARM (DISPOSABLE) ×4
DRAPE DA VINCI XI COLUMN (DISPOSABLE) ×1
ELECT REM PT RETURN 9FT ADLT (ELECTROSURGICAL) ×2
ELECTRODE REM PT RTRN 9FT ADLT (ELECTROSURGICAL) ×1 IMPLANT
GLOVE ORTHO TXT STRL SZ7.5 (GLOVE) ×6 IMPLANT
GOWN STRL REUS W/ TWL LRG LVL3 (GOWN DISPOSABLE) ×4 IMPLANT
GOWN STRL REUS W/TWL LRG LVL3 (GOWN DISPOSABLE) ×4
GRASPER SUT TROCAR 14GX15 (MISCELLANEOUS) ×2 IMPLANT
IRRIGATION STRYKERFLOW (MISCELLANEOUS) ×1 IMPLANT
IRRIGATOR STRYKERFLOW (MISCELLANEOUS) ×2
IV NS 1000ML (IV SOLUTION)
IV NS 1000ML BAXH (IV SOLUTION) IMPLANT
IV NS IRRIG 3000ML ARTHROMATIC (IV SOLUTION) ×2 IMPLANT
KIT PINK PAD W/HEAD ARE REST (MISCELLANEOUS) ×2
KIT PINK PAD W/HEAD ARM REST (MISCELLANEOUS) ×1 IMPLANT
KIT TURNOVER KIT A (KITS) ×2 IMPLANT
MANIFOLD NEPTUNE II (INSTRUMENTS) ×2 IMPLANT
NEEDLE HYPO 22GX1.5 SAFETY (NEEDLE) ×2 IMPLANT
NEEDLE INSUFFLATION 14GA 120MM (NEEDLE) ×2 IMPLANT
NS IRRIG 500ML POUR BTL (IV SOLUTION) ×2 IMPLANT
PACK LAP CHOLECYSTECTOMY (MISCELLANEOUS) ×2 IMPLANT
POUCH SPECIMEN RETRIEVAL 10MM (ENDOMECHANICALS) IMPLANT
SEAL CANN UNIV 5-8 DVNC XI (MISCELLANEOUS) ×4 IMPLANT
SEAL XI 5MM-8MM UNIVERSAL (MISCELLANEOUS) ×4
STAPLER CANNULA SEAL DVNC XI (STAPLE) IMPLANT
STAPLER CANNULA SEAL XI (STAPLE)
SUT MNCRL 4-0 (SUTURE) ×1
SUT MNCRL 4-0 27XMFL (SUTURE) ×1
SUT SILK 2 0 SH (SUTURE) ×4 IMPLANT
SUT VICRYL 0 AB UR-6 (SUTURE) ×2 IMPLANT
SUTURE MNCRL 4-0 27XMF (SUTURE) ×1 IMPLANT
TROCAR XCEL 12X100 BLDLESS (ENDOMECHANICALS) IMPLANT
TROCAR Z-THREAD FIOS 11X100 BL (TROCAR) ×2 IMPLANT
TUBING INSUFFLATION (TUBING) ×2 IMPLANT

## 2020-03-12 NOTE — ED Triage Notes (Signed)
Pt to ER via EMS with c/o epigastric pain that radiates into left side and up into left arm.  Pt states diaphoresis, denies n/v.  States pain started around 10:30 AM became some better and now has returned.  Pt describes pain as stabbing and grabbing.

## 2020-03-12 NOTE — H&P (Signed)
[facility] SURGICAL HISTORY & PHYSICAL (cpt [phone])  HISTORY OF PRESENT ILLNESS (HPI):  45 y.o. male presented to [facility] ED today for abdominal pain. Patient reports the abrupt onset of severe upper abdominal pain this morning around 1030 this morning. He described the pain as sharp and brought him to his knees. He got brief spontaneous relief and then the pain abruptly returned and did not subside. This was exacerbated by positional change and movement. No fever, chills, nausea, emesis, or bowel changes. 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 He had been on PPI for 6 months for prophylaxis and stopped this in the last week or two. He denied any NSAID or alcohol abuse. He has been exercising regularly, following dietary restrictions, and had lost 100+ lbs since the procedure. Laboratory work up in the ED was reassuring. However, CT Abdomen/pelvis was concerning for pneumoperitoneum and likely perforated marginal ulceration.    General surgery is consulted by emergency medicine physician Dr Jene Every, MD for evaluation and management of perforated viscus.   PAST MEDICAL HISTORY (PMH):  Past Medical History:  Diagnosis Date  . Family history of adverse reaction to anesthesia    MOMS BP DROPPED AND WAS SENT TO CCU  . GERD (gastroesophageal reflux disease)   . Sleep apnea    CPAP    Reviewed. Otherwise negative.   PAST SURGICAL HISTORY (PSH):  Past Surgical History:  Procedure Laterality Date  . ACHILLES TENDON SURGERY Left 2011   screws  . COLON SURGERY  2013   PERFORATED INTESTINE  . ETHMOIDECTOMY Left 05/05/2016   Procedure: ETHMOIDECTOMY;  Surgeon: Vernie Murders, MD;  Location: [facility];  Service: ENT;  Laterality: Left;  . FRONTAL SINUS EXPLORATION Left 05/05/2016   Procedure: FRONTAL SINUS EXPLORATION;  Surgeon: Vernie Murders, MD;  Location: [facility];  Service: ENT;  Laterality: Left;  . HERNIA REPAIR      AS A BABY  . IMAGE GUIDED SINUS SURGERY N/A 05/05/2016   Procedure: IMAGE GUIDED SINUS SURGERY;  Surgeon: Vernie Murders, MD;  Location: [facility];  Service: ENT;  Laterality: N/A;  . MAXILLARY ANTROSTOMY Bilateral 05/05/2016   Procedure: MAXILLARY ANTROSTOMY;  Surgeon: Vernie Murders, MD;  Location: [facility] OR[facility];  Service: ENT;  Laterality: Bilateral;  . NASAL SEPTUM SURGERY  2009    Reviewed. Otherwise negative.   MEDICATIONS:  Prior to Admission medications   Medication Sig Start Date End Date Taking? Authorizing Provider  fluticasone (FLONASE) 50 MCG/ACT nasal spray Place 2 sprays into both nostrils daily. 05/29/15 12/16/16  Renford Dills, NP  meloxicam (MOBIC) 15 MG tablet Take 1 tablet (15 mg total) by mouth daily as needed. 08/14/17   Tommie Sams, DO  omeprazole (PRILOSEC) 40 MG capsule Take 40 mg by mouth at bedtime.     [provider]     ALLERGIES:  No Known Allergies   SOCIAL HISTORY:  Social History   Socioeconomic History  . Marital status: Married    Spouse name: Not on file  . Number of children: Not on file  . Years of education: Not on file  . Highest education level: Not on file  Occupational History  . Not on file  Tobacco Use  . Smoking status: Former Smoker    Packs/day: 1.00    Years: 13.00    Pack years: 13.00    Types: Cigarettes    Quit date: 04/30/2007    Years since quitting: 12.8  .  Smokeless tobacco: Current User    Types: Chew  Vaping Use  . Vaping Use: Never used  Substance and Sexual Activity  . Alcohol use: Yes    Comment: RARE  . Drug use: No  . Sexual activity: Not on file  Other Topics Concern  . Not on file  Social History Narrative  . Not on file   Social Determinants of Health   Financial Resource Strain: Not on file  Food Insecurity: Not on file  Transportation Needs: Not on file  Physical Activity: Not on file  Stress: Not on file  Social Connections: Not on file  Intimate Partner Violence: Not on file     FAMILY  HISTORY:  No family history on file.  Otherwise negative.   REVIEW OF SYSTEMS:  Review of Systems  Constitutional: Negative for chills and fever.  Respiratory: Negative for cough and shortness of breath.   Cardiovascular: Negative for chest pain and palpitations.  Gastrointestinal: Positive for abdominal pain. Negative for constipation, diarrhea, nausea and vomiting.  Genitourinary: Negative for dysuria and urgency.  All other systems reviewed and are negative.   VITAL SIGNS:  Temp:  [97.6 F (36.4 C)] 97.6 F (36.4 C) (01/03 1149) Pulse Rate:  [62-93] 62 (01/03 1400) Resp:  [18-24] 18 (01/03 1400) BP: (91-146)/(50-105) 95/59 (01/03 1400) SpO2:  [96 %-100 %] 99 % (01/03 1400) Weight:  [109.8 kg] 109.8 kg (01/03 1150)     Height: 6' (182.9 cm) Weight: 109.8 kg BMI (Calculated): 32.81   PHYSICAL EXAM:  Physical Exam Vitals and nursing note reviewed. Exam conducted with a chaperone present.  Constitutional:      General: He is not in acute distress.    Appearance: He is well-developed. He is obese. He is not ill-appearing.     Comments: Laying on side  HENT:     Head: Normocephalic and atraumatic.  Eyes:     General: No scleral icterus.    Extraocular Movements: Extraocular movements intact.  Cardiovascular:     Rate and Rhythm: Normal rate and regular rhythm.     Heart sounds: Normal heart sounds. No murmur heard.   Pulmonary:     Effort: Pulmonary effort is normal. No respiratory distress.     Breath sounds: Normal breath sounds.  Abdominal:     General: A surgical scar is present. There is distension.     Tenderness: There is abdominal tenderness in the epigastric area. There is no guarding or rebound.     Comments: Abdomen is tight, tenderness to the epigastrium, mild distension, he does appear peritonitic   Genitourinary:    Comments: Deferred Skin:    General: Skin is warm and dry.     Coloration: Skin is not jaundiced or pale.  Neurological:     General: No  focal deficit present.     Mental Status: He is alert and oriented to person, place, and time.  Psychiatric:        Mood and Affect: Mood normal.        Behavior: Behavior normal.     INTAKE/OUTPUT:  This shift: No intake/output data recorded.  Last 2 shifts: @IOLAST2SHIFTS @  Labs:  CBC Latest Ref Rng & Units 03/12/2020 12/16/2016 12/19/2011  WBC 4.0 - 10.5 K/uL 7.3 8.1 7.8  Hemoglobin 13.0 - 17.0 g/dL 15.5 14.7 15.4  Hematocrit 39.0 - 52.0 % 47.1 43.4 43.8  Platelets 150 - 400 K/uL 319 289 280   CMP Latest Ref Rng & Units 03/12/2020 12/16/2016 12/18/2011  Glucose  70 - 99 mg/dL 545(G) 256(L) 893(T)  BUN 6 - 20 mg/dL 18 9 4(L)  Creatinine 3.42 - 1.24 mg/dL 8.76 8.11 5.72  Sodium 135 - 145 mmol/L 137 139 137  Potassium 3.5 - 5.1 mmol/L 4.2 4.3 3.9  Chloride 98 - 111 mmol/L 102 103 102  CO2 22 - 32 mmol/L 24 26 24   Calcium 8.9 - 10.3 mg/dL ) 9.2 9.1  Total Protein 6.5 - 8.1 g/dL 7.3 6.2(M) -  Total Bilirubin 0.3 - 1.2 mg/dL 1.0 0.9 -  Alkaline Phos 38 - 126 U/L 96 87 -  AST 15 - 41 U/L 23 22 -  ALT 0 - 44 U/L 18 21 -     Imaging studies:   CT Abdomen/Pelvis (03/13/2019) personally reviewed concerning for perforated marginal ulcer with pneumoperitoneum, and radiologist report reviewed:  IMPRESSION: Changes consistent with a perforated marginal ulcer near the gastrojejunostomy anastomosis with free air and free fluid identified.  Mildly dilated small bowel though no true obstructive changes are seen. This may be reactive secondary to the perforation.    Assessment/Plan: (ICD-10's: R19.8) 45 y.o. 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 We did discuss potential transfer, however, his BP had been  trending down in the ED, and I do feel is is more prudent to pursue emergent intervention than risk decompensation with transfer to outside facility.   - All risks, benefits, and alternatives to above procedure(s) were discussed with the patient and his wife at bedside, all of their questions were answered to their expressed satisfaction, patient expresses he wishes to proceed, and informed consent was obtained.  - NPO + IVF Resuscitation   - IV Abx (Zosyn)  - Monitor abdominal examination  - Pain control prn (avoid NSAIDs); antiemetics prn  - IV PPI   - DVT prophylaxis; Hold   All of the above findings and recommendations were discussed with the patient and his wife at bedside, and all of their questions were answered to their expressed satisfaction.  -- Claudine Mouton, PA-C [facility] 03/12/2020, 3:58 PM [phone] M-F: 7am - 4pm

## 2020-03-12 NOTE — Anesthesia Procedure Notes (Signed)
Procedure Name: Intubation Date/Time: 03/12/2020 4:59 PM Performed by: Ginger Carne, CRNA Pre-anesthesia Checklist: Patient identified, Emergency Drugs available, Suction available, Patient being monitored and Timeout performed Patient Re-evaluated:Patient Re-evaluated prior to induction Oxygen Delivery Method: Circle system utilized Preoxygenation: Pre-oxygenation with 100% oxygen Induction Type: IV induction Ventilation: Mask ventilation without difficulty Laryngoscope Size: McGraph and 3 Grade View: Grade II Tube type: Oral Tube size: 7.5 mm Number of attempts: 1 Airway Equipment and Method: Stylet Placement Confirmation: ETT inserted through vocal cords under direct vision,  positive ETCO2 and breath sounds checked- equal and bilateral Secured at: 24 cm Tube secured with: Tape Dental Injury: Teeth and Oropharynx as per pre-operative assessment  Comments: Large floppy epiglotis

## 2020-03-12 NOTE — ED Provider Notes (Signed)
[facility] Emergency Department Provider Note   ____________________________________________    I have reviewed the triage vital signs and the nursing notes.   HISTORY  Chief Complaint Abdominal Pain     HPI Jeremy [name] is a 45 y.o. male who presents with complaints of abdominal pain.  Patient reports a history of Roux-en-Y gastric [name], Jeremy intestine from diverticulosis in the past.  Complains of relatively abrupt onset of periumbilical abdominal pain this morning.  [name] felt well yesterday.  Complains of nausea and vomiting and severe cramping pain.  No chest pain.  Pain is primarily periumbilical and left lower quadrant.  Has not take anything for this.  No fevers reported  Past Medical History:  Diagnosis Date  . Family history of adverse reaction to anesthesia    MOMS BP DROPPED AND WAS SENT TO CCU  . GERD (gastroesophageal reflux disease)   . Sleep apnea    CPAP    Patient Active Problem List   Diagnosis Date Noted  . Jeremy viscus 03/12/2020    Past Surgical History:  Procedure Laterality Date  . ACHILLES TENDON SURGERY Left 2011   screws  . COLON SURGERY  2013   [facility]y INTESTINE  . ETHMOIDECTOMY Left 05/05/2016   Procedure: ETHMOIDECTOMY;  Surgeon: Margaretha Sheffield, MD;  Location: [facility] OR[facility];  Service: ENT;  Laterality: Left;  . FRONTAL SINUS EXPLORATION Left 05/05/2016   Procedure: FRONTAL SINUS EXPLORATION;  Surgeon: Margaretha Sheffield, MD;  Location: [facility] OR[facility];  Service: ENT;  Laterality: Left;  . HERNIA REPAIR     AS A BABY  . IMAGE GUIDED SINUS SURGERY N/A 05/05/2016   Procedure: IMAGE GUIDED SINUS SURGERY;  Surgeon: Margaretha Sheffield, MD;  Location: [facility] OR[facility];  Service: ENT;  Laterality: N/A;  . MAXILLARY ANTROSTOMY Bilateral 05/05/2016   Procedure: MAXILLARY ANTROSTOMY;  Surgeon: Margaretha Sheffield, MD;  Location: [facility];  Service: ENT;  Laterality: Bilateral;  . NASAL SEPTUM SURGERY  2009    Prior to Admission medications    Medication Sig Start Date End Date Taking? Authorizing Provider  fluticasone (FLONASE) 50 MCG/ACT nasal spray Place 2 sprays into both nostrils daily. 05/29/15 12/16/16  Marylene Land, NP  meloxicam (MOBIC) 15 MG tablet Take 1 tablet (15 mg total) by mouth daily as needed. 08/14/17   Coral Spikes, DO  omeprazole (PRILOSEC) 40 MG capsule Take 40 mg by mouth at bedtime.     [provider]     Allergies Patient has no known allergies.  No family history on file.  Social History Social History   Tobacco Use  . Smoking status: Former Smoker    Packs/day: 1.00    Years: 13.00    Pack years: 13.00    Types: Cigarettes    Quit date: 04/30/2007    Years since quitting: 12.8  . Smokeless tobacco: Current User    Types: Chew  Vaping Use  . Vaping Use: Never used  Substance Use Topics  . Alcohol use: Yes    Comment: RARE  . Drug use: No    Review of Systems  Constitutional: No fever/chills Eyes: No visual changes.  ENT: No sore throat. Cardiovascular: Denies chest pain. Respiratory: Denies shortness of breath. Gastrointestinal: As above Genitourinary: Negative for dysuria. Musculoskeletal: Negative for back pain. Skin: Negative for rash. Neurological: Negative for headaches    ____________________________________________   PHYSICAL EXAM:  VITAL SIGNS: ED Triage Vitals  Enc Vitals Group     BP 03/12/20 1149 (!) 146/105  Pulse Rate 03/12/20 1149 93     Resp 03/12/20 1149 (!) 24     Temp 03/12/20 1149 97.6 F (36.4 C)     Temp Source 03/12/20 1149 Oral     SpO2 03/12/20 1149 100 %     Weight 03/12/20 1150 109.8 kg (242 lb)     Height 03/12/20 1150 1.829 m (6')     Head Circumference --      Peak Flow --      Pain Score 03/12/20 1154 10     Pain Loc --      Pain Edu? --      Excl. in GC? --     Constitutional: Alert and oriented.   Nose: No congestion/rhinnorhea. Mouth/Throat: Mucous membranes are moist.   Neck:  Painless ROM Cardiovascular:  Normal rate, regular rhythm. Grossly normal heart sounds.  Good peripheral circulation. Respiratory: Normal respiratory effort.  No retractions. Lungs CTAB. Gastrointestinal: Soft, mild tenderness periumbilically, overall reassuring exam given presentation, no distention.  No CVA tenderness.  Musculoskeletal:  Warm and well perfused Neurologic:  Normal speech and language. No gross focal neurologic deficits are appreciated.  Skin:  Skin is warm, dry and intact. No rash noted. Psychiatric: Mood and affect are normal. Speech and behavior are normal.  ____________________________________________   LABS (all labs ordered are listed, but only abnormal results are displayed)  Labs Reviewed  URINALYSIS, COMPLETE (UACMP) WITH MICROSCOPIC - Abnormal; Notable for the following components:      Result Value   Color, Urine YELLOW (*)    APPearance HAZY (*)    Ketones, ur 5 (*)    All other components within normal limits  COMPREHENSIVE METABOLIC PANEL - Abnormal; Notable for the following components:   Glucose, Bld 124 (*)    Calcium 8.7 (*)    All other components within normal limits  RESP PANEL BY RT-PCR (FLU A&B, COVID) ARPGX2  CBC  LIPASE, BLOOD   ____________________________________________  EKG  ED ECG REPORT I, Jene Every, the attending physician, personally viewed and interpreted this ECG.  Date: 03/12/2020  Rhythm: normal sinus rhythm QRS Axis: normal Intervals: normal ST/T Wave abnormalities: normal Narrative Interpretation: no evidence of acute ischemia  ____________________________________________  RADIOLOGY  CT abdomen pelvis reviewed by me ____________________________________________   PROCEDURES  Procedure(s) performed: No  Procedures   Critical Care performed: yes  CRITICAL CARE Performed by: Jene Every   Total critical care time:35 minutes  Critical care time was exclusive of separately billable procedures and treating other  patients.  Critical care was necessary to treat or prevent imminent or life-threatening deterioration.  Critical care was time spent personally by me on the following activities: development of treatment plan with patient and/or surrogate as well as nursing, discussions with consultants, evaluation of patient's response to treatment, examination of patient, obtaining history from patient or surrogate, ordering and performing treatments and interventions, ordering and review of laboratory studies, ordering and review of radiographic studies, pulse oximetry and re-evaluation of patient's condition.  ____________________________________________   INITIAL IMPRESSION / ASSESSMENT AND PLAN / ED COURSE  Pertinent labs & imaging results that were available during my care of the patient were reviewed by me and considered in my medical decision making (see chart for details).  Patient presents with periumbilical abdominal pain described above.  Differential is extensive includes, gastritis, PUD, diverticulitis, enteritis, SBO  Patient received IV fentanyl, continues to have pain, will add IV morphine, IV Zofran, IV fluids.  Obtain CT abdomen pelvis.  Noted  drop in patient's blood pressure but [name] continues to have normal heart rate and feels improved, will closely monitor  CT abdomen pelvis, reviewed by me, free fluid noted, radiology notes likely Jeremy marginal ulcer  Consulted Dr. Claudine Mouton of general surgery,   Dr. Claudine Mouton will take to the operating room    ____________________________________________   FINAL CLINICAL IMPRESSION(S) / ED DIAGNOSES  Final diagnoses:  Jeremy ulcer of intestine (HCC)        Note:  This document was prepared using Dragon voice recognition software and may include unintentional dictation errors.   Jene Every, MD 03/12/20 1534

## 2020-03-12 NOTE — Op Note (Signed)
Robotic diagnostic laparoscopy, repair of perforated jejunal marginal ulcer with Cheree Ditto patch.  Pre-operative Diagnosis: Perforated marginal ulcer.  Post-operative Diagnosis:  Same.  Procedure: Robotic assisted diagnostic laparoscopy, repair of perforated jejunal marginal ulcer with Cheree Ditto patch.  Surgeon: Campbell Lerner, M.D., FACS  Anesthesia: General. with endotracheal tube  Findings: Antecolic Roux-en-Y gastric bypass.  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  Adjacent inflammatory changes.  Marked scarring of gastric remnant to inferior/cephalad aspect of liver.  Marked adhesions to gallbladder.  Estimated Blood Loss: 25 mL         Drains: None         Specimens: None       Complications: None  Procedure Details  The patient was seen in the emergency room.    The benefits, complications, treatment options, risks and expected outcomes were discussed with the patient. The likelihood of improving the patient's symptoms with return to their baseline status is reasonable.  The patient and/or family concurred with the proposed plan, giving informed consent, again alternatives and potential for transfer reviewed.  The patient was taken to Operating Room, identified, and the procedure verified.  Prior to the induction of general anesthesia, antibiotics were administered. VTE prophylaxis was in place. General endotracheal anesthesia was then administered and tolerated well. The patient was positioned in the supine position.  After the induction, a Foley was placed, the abdomen was prepped with Chloraprep and draped in the sterile fashion.  A Time Out was held and the above information confirmed.  After local infiltration of quarter percent Marcaine with epinephrine, stab incision was made left upper quadrant.  Just below the costal margin approximately midclavicular line the Veres needle is passed with sensation of the layers to penetrate the abdominal wall  and into the peritoneum.  Saline drop test is confirmed peritoneal placement.  Insufflation is initiated with carbon dioxide to pressures of 15 mmHg.  Left lateral abdominal wall, I made a 12 mm incision on the left periumbilical site, I advanced an optical 5mm port under direct visualization into the peritoneal cavity.  Once the peritoneum was penetrated, insufflation was transferred.  The trocar was then advanced into the abdominal cavity under direct visualization. Pneumoperitoneum was then continued with CO2 at 14 mmHg or less and tolerated well without any adverse changes in the patient's vital signs.  3 8.5-mm ports were placed in the prior scars, just to the left of midline and to of the right abdominal wall nearly transverse. The patient was positioned  in mild reverse Trendelenburg.  Da Vinci XI robot was then positioned on to the patient's left side, and docked.    The above photo was taken, noting the discolored peritoneal fluid, and the adhesions adjacent to the liver.  With gentle blunt dissection I was able to proceed with lysing the adhesions of the left lobe of the liver to the gastrojejunal junction.  The site of perforation was identified.     I proceeded to cautiously lyse all the adjacent adhesions to mobilize the area for repair. I then proceeded to repair the presumed jejunal perforation, believing I can readily identified the gastrojejunal anastomosis through the opening.  I passed the fenestrated graspers into the jejunum and proximal into the stomach without difficulty.  I reapproximated the ulcer longitudinally, without tension utilizing interrupted 2-0 silk sutures reapproximating the anterior jejunal wall to the scarred ridge where the staple line was noted.   A readily available omental pedicle was then placed over  the repair utilizing the tails of the 2-0 silk sutures.  This completed the repair.    Then proceeded with irrigating the abdominal cavity with serial  delusional technique using a total of 4 L of normal saline solution.  Inspection of the right and left upper quadrants was performed.  The left abdominal wall port site fascia was closed with interrumpted 0 Vicryl sutures using PMI/cone under direct visualization. Pneumoperitoneum was released and ports removed.  4-0 subcuticular Monocryl was used to close the skin. Dermabond was  applied.  The patient was then extubated and brought to the recovery room in stable condition. Sponge, lap, and needle counts were correct at closure and at the conclusion of the case.               Campbell Lerner, M.D., [facility] 03/12/2020 7:33 PM

## 2020-03-12 NOTE — Anesthesia Preprocedure Evaluation (Signed)
Anesthesia Evaluation  Patient identified by MRN, date of birth, ID band Patient awake    Reviewed: Allergy & Precautions, NPO status , Patient's Chart, lab work & pertinent test results  History of Anesthesia Complications Negative for: history of anesthetic complications  Airway Mallampati: III       Dental   Pulmonary sleep apnea and Continuous Positive Airway Pressure Ventilation , neg COPD, former smoker,           Cardiovascular (-) hypertension(-) Past MI and (-) CHF (-) dysrhythmias (-) Valvular Problems/Murmurs     Neuro/Psych neg Seizures    GI/Hepatic Neg liver ROS, GERD  Medicated,  Endo/Other  neg diabetes  Renal/GU negative Renal ROS     Musculoskeletal   Abdominal   Peds  Hematology   Anesthesia Other Findings   Reproductive/Obstetrics                             Anesthesia Physical Anesthesia Plan  ASA: III  Anesthesia Plan: General   Post-op Pain Management:    Induction: Intravenous  PONV Risk Score and Plan: 2  Airway Management Planned: Oral ETT  Additional Equipment:   Intra-op Plan:   Post-operative Plan:   Informed Consent: I have reviewed the patients History and Physical, chart, labs and discussed the procedure including the risks, benefits and alternatives for the proposed anesthesia with the patient or authorized representative who has indicated his/her understanding and acceptance.       Plan Discussed with:   Anesthesia Plan Comments:         Anesthesia Quick Evaluation

## 2020-03-12 NOTE — Transfer of Care (Signed)
Immediate Anesthesia Transfer of Care Note  Patient: Jeremy [name]  Procedure(s) Performed: XI ROBOT ASSISTED DIAGNOSTIC LAPAROSCOPY; REPAIR OF PERFORATED MARGINAL ULCER GRAHAM PATCH (N/A )  Patient Location: PACU  Anesthesia Type:General  Level of Consciousness: awake, alert , oriented and patient cooperative  Airway & Oxygen Therapy: Patient Spontanous Breathing and Patient connected to nasal cannula oxygen  Post-op Assessment: Report given to RN and Post -op Vital signs reviewed and stable  Post vital signs: Reviewed and stable  Last Vitals:  Vitals Value Taken Time  BP 130/51 03/12/20 1934  Temp    Pulse 88 03/12/20 1940  Resp 24 03/12/20 1940  SpO2 100 % 03/12/20 1940  Vitals shown include unvalidated device data.  Last Pain:  Vitals:   03/12/20 1934  TempSrc:   PainSc: Asleep         Complications: No complications documented.

## 2020-03-12 NOTE — ED Notes (Signed)
Report given to surgery nurse.

## 2020-03-12 NOTE — Anesthesia Postprocedure Evaluation (Signed)
Anesthesia Post Note  Patient: Jeremy [name]  Procedure(s) Performed: XI ROBOT ASSISTED DIAGNOSTIC LAPAROSCOPY; REPAIR OF PERFORATED MARGINAL ULCER GRAHAM PATCH (N/A )  Patient location during evaluation: PACU Anesthesia Type: General Level of consciousness: awake and alert Pain management: pain level controlled Vital Signs Assessment: post-procedure vital signs reviewed and stable Respiratory status: spontaneous breathing and respiratory function stable Cardiovascular status: stable Anesthetic complications: no   No complications documented.   Last Vitals:  Vitals:   03/12/20 1940 03/12/20 1949  BP:  (!) 124/50  Pulse:  90  Resp:  (!) 24  Temp:    SpO2: 100% 100%    Last Pain:  Vitals:   03/12/20 1934  TempSrc:   PainSc: Asleep                 Mayra Jolliffe K

## 2020-03-13 ENCOUNTER — Encounter: Payer: Self-pay | Admitting: Surgery

## 2020-03-13 LAB — CBC
HCT: 40 % (ref 39.0–52.0)
Hemoglobin: 13.3 g/dL (ref 13.0–17.0)
MCH: 28.9 pg (ref 26.0–34.0)
MCHC: 33.3 g/dL (ref 30.0–36.0)
MCV: 86.8 fL (ref 80.0–100.0)
Platelets: 260 10*3/uL (ref 150–400)
RBC: 4.61 MIL/uL (ref 4.22–5.81)
RDW: 15 % (ref 11.5–15.5)
WBC: 11.6 10*3/uL — ABNORMAL HIGH (ref 4.0–10.5)
nRBC: 0 % (ref 0.0–0.2)

## 2020-03-13 LAB — COMPREHENSIVE METABOLIC PANEL
ALT: 19 U/L (ref 0–44)
AST: 16 U/L (ref 15–41)
Albumin: 3.6 g/dL (ref 3.5–5.0)
Alkaline Phosphatase: 81 U/L (ref 38–126)
Anion gap: 9 (ref 5–15)
BUN: 14 mg/dL (ref 6–20)
CO2: 24 mmol/L (ref 22–32)
Calcium: 8.6 mg/dL — ABNORMAL LOW (ref 8.9–10.3)
Chloride: 106 mmol/L (ref 98–111)
Creatinine, Ser: 0.63 mg/dL (ref 0.61–1.24)
GFR, Estimated: >60 mL/min (ref >60)
Glucose, Bld: 134 mg/dL — ABNORMAL HIGH (ref 70–99)
Potassium: 4.1 mmol/L (ref 3.5–5.1)
Sodium: 139 mmol/L (ref 135–145)
Total Bilirubin: 1.1 mg/dL (ref 0.3–1.2)
Total Protein: 6.8 g/dL (ref 6.5–8.1)

## 2020-03-13 LAB — PHOSPHORUS: Phosphorus: 3.8 mg/dL (ref 2.5–4.6)

## 2020-03-13 LAB — MAGNESIUM: Magnesium: 1.8 mg/dL (ref 1.7–2.4)

## 2020-03-13 NOTE — Progress Notes (Signed)
[facility] SURGICAL PROGRESS NOTE  Hospital Day(s): 1.   Post op day(s): 1 Day Post-Op.   Interval History:  Patient seen and examined No acute events or new complaints overnight.  Patient reports he is feeling better than presentation yesterday, some abdominal soreness No fever, chills, nausea, emesis Mild leukocytosis to 11.6k; likely reactive sCr remains normal at 0.63, UO not recorded No electrolyte derangements No NGT output recorded Remained NPO Continues on IV Zosyn  Vital signs in last 24 hours: [min-max] current  Temp:  [97.1 F (36.2 C)-98.4 F (36.9 C)] 98 F (36.7 C) (01/04 0427) Pulse Rate:  [62-102] 79 (01/04 0427) Resp:  [16-26] 16 (01/04 0427) BP: (91-146)/(50-105) 117/60 (01/04 0427) SpO2:  [94 %-100 %] 95 % (01/04 0427) Weight:  [109.8 kg] 109.8 kg (01/03 1150)     Height: 6' (182.9 cm) Weight: 109.8 kg BMI (Calculated): 32.81   Intake/Output last 2 shifts:  01/03 0701 - 01/04 0700 In: 1912.9 [I.V.:1812.9; IV Piggyback:100] Out: -    Physical Exam:  Constitutional: alert, cooperative and no distress  HEENT: NGT in place; minimal output Respiratory: breathing non-labored at rest  Cardiovascular: regular rate and sinus rhythm  Gastrointestinal: Soft, incisional soreness, non-distended, no rebound/guarding Integumentary: Laparoscopic incisions are CDI with dermabond, no erythema or drainage   Labs:  CBC Latest Ref Rng & Units 03/13/2020 03/12/2020 12/16/2016  WBC 4.0 - 10.5 K/uL 11.6(H) 7.3 8.1  Hemoglobin 13.0 - 17.0 g/dL 34.1 96.2 22.9  Hematocrit 39.0 - 52.0 % 40.0 47.1 43.4  Platelets 150 - 400 K/uL 260 319 289   CMP Latest Ref Rng & Units 03/13/2020 03/12/2020 12/16/2016  Glucose 70 - 99 mg/dL 798(X) 211(H) 417(E)  BUN 6 - 20 mg/dL 14 18 9   Creatinine 0.61 - 1.24 mg/dL 0.81 4.48  Sodium 135 - 145 mmol/L 139 137 139  Potassium 3.5 - 5.1 mmol/L 4.1 4.2 4.3  Chloride 98 - 111 mmol/L 106 102 103  CO2 22 - 32 mmol/L 24 24 26    Calcium 8.9 - 10.3 mg/dL 1.85) ) 9.2  Total Protein 6.5 - 8.1 g/dL 6.8 7.3 6.3(J)  Total Bilirubin 0.3 - 1.2 mg/dL 1.1 1.0 0.9  Alkaline Phos 38 - 126 U/L 81 96 87  AST 15 - 41 U/L 16 23 22   ALT 0 - 44 U/L 19 18 21     Imaging studies: No new pertinent imaging studies   Assessment/Plan: 45 y.o. male overall doing well 1 Day Post-Op s/p robotic assisted laparoscopic repair of perforated jejunal marginal ulceration with omental 0.2(O patch.   - Remain strict NPO + NGT decompression; likely require 3-5 days followed by UGI series to assess anastomosis before pulling NGT   - Continue IVF resuscitation  - Monitor abdominal examination; on-going bowel function   - Pain control prn; antiemetics prn  - Mobilization encouraged    All of the above findings and recommendations were discussed with the patient, and the medical team, and all of patient's questions were answered to his expressed satisfaction.  -- , PA-C [facility] 03/13/2020, 7:23 AM [phone] M-F: 7am - 4pm

## 2020-03-14 LAB — BASIC METABOLIC PANEL
Anion gap: 8 (ref 5–15)
BUN: 15 mg/dL (ref 6–20)
CO2: 25 mmol/L (ref 22–32)
Calcium: 8.8 mg/dL — ABNORMAL LOW (ref 8.9–10.3)
Chloride: 105 mmol/L (ref 98–111)
Creatinine, Ser: 0.68 mg/dL (ref 0.61–1.24)
GFR, Estimated: >60 mL/min (ref >60)
Glucose, Bld: 88 mg/dL (ref 70–99)
Potassium: 3.9 mmol/L (ref 3.5–5.1)
Sodium: 138 mmol/L (ref 135–145)

## 2020-03-14 LAB — CBC
HCT: 37.1 % — ABNORMAL LOW (ref 39.0–52.0)
Hemoglobin: 12.1 g/dL — ABNORMAL LOW (ref 13.0–17.0)
MCH: 28.7 pg (ref 26.0–34.0)
MCHC: 32.6 g/dL (ref 30.0–36.0)
MCV: 87.9 fL (ref 80.0–100.0)
Platelets: 230 10*3/uL (ref 150–400)
RBC: 4.22 MIL/uL (ref 4.22–5.81)
RDW: 15.3 % (ref 11.5–15.5)
WBC: 9.6 10*3/uL (ref 4.0–10.5)
nRBC: 0 % (ref 0.0–0.2)

## 2020-03-14 NOTE — Progress Notes (Signed)
[facility] SURGICAL PROGRESS NOTE  Hospital Day(s): 2.   Post op day(s): 2 Days Post-Op.   Interval History:  Patient seen and examined No acute events or new complaints overnight.  Patient reports he is feeling better Abdominal soreness, no fever, chills, nausea, emesis Previously seen, likely reactive, leukocytosis now resolved to 9.6k sCr normal 0.68, making good urine No electrolyte derangements NGT output 50 ccs He has begun to pass flatus in the last 24 hours  Remained NPO Continues on IV Zosyn  Vital signs in last 24 hours: [min-max] current  Temp:  [97.8 F (36.6 C)-99.4 F (37.4 C)] 98.6 F (37 C) (01/05 0438) Pulse Rate:  [66-74] 72 (01/05 0438) Resp:  [16-20] 20 (01/05 0438) BP: (126-129)/(66-73) 128/73 (01/05 0438) SpO2:  [93 %-98 %] 93 % (01/05 0438)     Height: 6' (182.9 cm) Weight: 109.8 kg BMI (Calculated): 32.81   Intake/Output last 2 shifts:  01/04 0701 - 01/05 0700 In: 100.1 [IV Piggyback:100.1] Out: 50 [Emesis/NG output:50]   Physical Exam:  Constitutional: alert, cooperative and no distress  HEENT: NGT in place; minimal output Respiratory: breathing non-labored at rest  Cardiovascular: regular rate and sinus rhythm  Gastrointestinal: Soft, incisional soreness, non-distended, no rebound/guarding Integumentary: Laparoscopic incisions are CDI with dermabond, no erythema or drainage    Labs:  CBC Latest Ref Rng & Units 03/14/2020 03/13/2020 03/12/2020  WBC 4.0 - 10.5 K/uL 9.6 11.6(H) 7.3  Hemoglobin 13.0 - 17.0 g/dL 12.1(L) 13.3 15.5  Hematocrit 39.0 - 52.0 % 37.1(L) 40.0 47.1  Platelets 150 - 400 K/uL 230 260 319   CMP Latest Ref Rng & Units 03/14/2020 03/13/2020 03/12/2020  Glucose 70 - 99 mg/dL 88 921(J) 941(D)  BUN 6 - 20 mg/dL 15 14 18   Creatinine 0.61 - 1.24 mg/dL 4.08 1.44  Sodium 135 - 145 mmol/L 138 139 137  Potassium 3.5 - 5.1 mmol/L 3.9 4.1 4.2  Chloride 98 - 111 mmol/L 105 106 102  CO2 22 - 32 mmol/L 25 24 24   Calcium  8.9 - 10.3 mg/dL 8.18) ) 5.6(D)  Total Protein 6.5 - 8.1 g/dL - 6.8 7.3  Total Bilirubin 0.3 - 1.2 mg/dL - 1.1 1.0  Alkaline Phos 38 - 126 U/L - 81 96  AST 15 - 41 U/L - 16 23  ALT 0 - 44 U/L - 19 18     Imaging studies: No new pertinent imaging studies   Assessment/Plan:  45 y.o. male 2 Days Post-Op s/p robotic assisted laparoscopic repair of perforated jejunal marginal ulceration with omental 7.0(Y patch.   - Remain strict NPO + NGT decompression; likely require 3-5 days followed by UGI series (likely Friday 01/07) to assess anastomosis before pulling NGT    - Continue IVF resuscitation             - Monitor abdominal examination; on-going bowel function              - Pain control prn; antiemetics prn             - Mobilization encouraged     - monitor electrolytes while NPO    All of the above findings and recommendations were discussed with the patient, patient's family (wife at bedside), and the medical team, and all of patient's and family's questions were answered to their expressed satisfaction.  -- Tuesday, PA-C [facility] 03/14/2020, 7:26 AM [phone] M-F: 7am - 4pm

## 2020-03-15 ENCOUNTER — Inpatient Hospital Stay: Payer: BC Managed Care – PPO

## 2020-03-15 MED ORDER — OMEPRAZOLE 40 MG PO CPDR: 40.0000 mg | DELAYED_RELEASE_CAPSULE | Freq: Two times a day (BID) | ORAL | 3 refills | Status: DC

## 2020-03-15 MED ORDER — OMEPRAZOLE 40 MG PO CPDR: 40.0000 mg | DELAYED_RELEASE_CAPSULE | Freq: Every day | ORAL | 3 refills | Status: DC

## 2020-03-15 MED ORDER — BOOST / RESOURCE BREEZE PO LIQD CUSTOM: 1.0000 | Freq: Three times a day (TID) | ORAL | Status: DC

## 2020-03-15 MED ORDER — IOHEXOL 300 MG/ML  SOLN
150.0000 mL | Freq: Once | INTRAMUSCULAR | Status: AC | PRN
  Administered 2020-03-15: 150 mL

## 2020-03-15 MED ORDER — HYDROCODONE-ACETAMINOPHEN 5-325 MG PO TABS: 1.0000 | ORAL_TABLET | ORAL | Status: DC | PRN

## 2020-03-15 MED ORDER — AMOXICILLIN-POT CLAVULANATE 875-125 MG PO TABS: 1.0000 | ORAL_TABLET | Freq: Two times a day (BID) | ORAL | 0 refills | Status: AC

## 2020-03-15 MED ORDER — HYDROCODONE-ACETAMINOPHEN 5-325 MG PO TABS: 1.0000 | ORAL_TABLET | Freq: Four times a day (QID) | ORAL | 0 refills | Status: DC | PRN

## 2020-03-15 NOTE — Progress Notes (Deleted)
[facility] SURGICAL PROGRESS NOTE  Hospital Day(s): 3.   Post op day(s): 3 Days Post-Op.   Interval History:  Patient seen and examined No acute events or new complaints overnight.  Patient reports he is feeling great this morning No abdominal pain, nausea, emesis, fever, chills No new labs this morning No NGT output recorded He continues to have signs of bowel function Mobilizing well  Vital signs in last 24 hours: [min-max] current  Temp:  [97.7 F (36.5 C)-98.4 F (36.9 C)] 98 F (36.7 C) (01/06 0521) Pulse Rate:  [58-78] 61 (01/06 0521) Resp:  [16-20] 20 (01/06 0521) BP: (121-135)/(57-78) 124/57 (01/06 0521) SpO2:  [97 %-100 %] 97 % (01/06 0521)     Height: 6' (182.9 cm) Weight: 109.8 kg BMI (Calculated): 32.81   Intake/Output last 2 shifts:  No intake/output data recorded.   Physical Exam:  Constitutional: alert, cooperative and no distress HEENT: NGT in place; minimal output Respiratory: breathing non-labored at rest  Cardiovascular: regular rate and sinus rhythm  Gastrointestinal:Soft, non-tender, non-distended, no rebound/guarding Integumentary:Laparoscopic incisions are CDI with dermabond, no erythema or drainage  Labs:  CBC Latest Ref Rng & Units 03/14/2020 03/13/2020 03/12/2020  WBC 4.0 - 10.5 K/uL 9.6 11.6(H) 7.3  Hemoglobin 13.0 - 17.0 g/dL 12.1(L) 13.3 15.5  Hematocrit 39.0 - 52.0 % 37.1(L) 40.0 47.1  Platelets 150 - 400 K/uL 230 260 319   CMP Latest Ref Rng & Units 03/14/2020 03/13/2020 03/12/2020  Glucose 70 - 99 mg/dL 88 169(C) 789(F)  BUN 6 - 20 mg/dL 15 14 18   Creatinine 0.61 - 1.24 mg/dL 8.10 1.75  Sodium 135 - 145 mmol/L 138 139 137  Potassium 3.5 - 5.1 mmol/L 3.9 4.1 4.2  Chloride 98 - 111 mmol/L 105 106 102  CO2 22 - 32 mmol/L 25 24 24   Calcium 8.9 - 10.3 mg/dL 1.02) ) 5.8(N)  Total Protein 6.5 - 8.1 g/dL - 6.8 7.3  Total Bilirubin 0.3 - 1.2 mg/dL - 1.1 1.0  Alkaline Phos 38 - 126 U/L - 81 96  AST 15 - 41 U/L - 16 23   ALT 0 - 44 U/L - 19 18     Imaging studies: No new pertinent imaging studies   Assessment/Plan:  45 y.o. male overall doing well 3 Days Post-Op s/p robotic assisted laparoscopic repair of perforated jejunal marginal ulceration with omental 8.2(U patch.   - Will plan to complete UGI series this morning to reassess repeair   - Remain strict NPO + NGT decompression; pending UGI   - Continue IVF resuscitation - Monitor abdominal examination; on-going bowel function  - Pain control prn; antiemetics prn - Mobilization encouraged              - Monitor electrolytes while NPO    All of the above findings and recommendations were discussed with the patient*, and the medical team, and all of patient's questions were answered to his expressed satisfaction.  -- 59, PA-C [facility] 03/15/2020, 7:10 AM [phone] M-F: 7am - 4pm

## 2020-03-15 NOTE — Discharge Instructions (Signed)
In addition to included general post-operative instructions...  Diet: Continue liquid diet at home until follow up with Dr Claudine Mouton.    Activity: No heavy lifting >20 pounds (children, pets, laundry, garbage) for 4 weeks, but light activity and walking are encouraged. Do not drive or drink alcohol if taking narcotic pain medications or having pain that might distract from driving.  Wound care: You may shower/get incision wet with soapy water and pat dry (do not rub incisions), but no baths or submerging incision underwater until follow-up.   Medications: Resume all home medications, restart PPI, avoid use of NSAIDs (ibuprofen, Advil, etc). For mild to moderate pain: acetaminophen (Tylenol). Combining Tylenol with alcohol can substantially increase your risk of causing liver disease. Narcotic pain medications, if prescribed, can be used for severe pain, though may cause nausea, constipation, and drowsiness. Do not combine Tylenol and Percocet (or similar) within a 6 hour period as Percocet (and similar) contain(s) Tylenol. If you do not need the narcotic pain medication, you do not need to fill the prescription.  Call office [phone] / [phone]) at any time if any questions, worsening pain, fevers/chills, bleeding, drainage from incision site, or other concerns.

## 2020-03-15 NOTE — Progress Notes (Signed)
MD ordered patient to be discharged home.  Discharge instructions were reviewed with the patient and he voiced understanding. Patient instructed on making the follow-up.  Prescriptions sent to the patient pharmacy.  IV was removed with catheter intact.  All patients questions were answered.  Patient walked out with his wife.  Refused the wheelchair

## 2020-03-15 NOTE — Discharge Summary (Addendum)
[facility] SUR[facility] DISCHARGE SUMMARY  Patient ID: Jeremy [name] MRN: [id number] DOB/AGE: 1976/02/19 45 y.o.  Admit date: 03/12/2020 Discharge date: 03/15/2020  Discharge Diagnoses Patient Active Problem List   Diagnosis Date Noted  . Perforated viscus 03/12/2020  . Perforated ulcer (HCC) 03/12/2020    Consultants None  Procedures 03/12/2020: Robotic diagnostic laparoscopy, repair of perforated jejunal marginal ulcer with Cheree Ditto patch.  HPI: 45 y.o. male presented to [facility] ED today for abdominal pain. Patient reports the abrupt onset of severe upper abdominal pain this morning around 1030 this morning. He described the pain as sharp and brought him to his knees. He got brief spontaneous relief and then the pain abruptly returned and did not subside. This was exacerbated by positional change and movement. No fever, chills, nausea, emesis, or bowel changes. 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 He had been on PPI for 6 months for prophylaxis and stopped this in the last week or two. He denied any NSAID or alcohol abuse. He has been exercising regularly, following dietary restrictions, and had lost 100+ lbs since the procedure. Laboratory work up in the ED was reassuring. However, CT Abdomen/pelvis was concerning for pneumoperitoneum and likely perforated marginal ulceration.    Hospital Course: Informed consent was obtained and documented, and patient underwent uneventful robotic diagnostic laparoscopy, repair of perforated jejunal marginal ulcer with Cheree Ditto patch (Dr Claudine Mouton, 03/12/2020). Post-operatively, patient remained with NGT and strict NPO until POD#. On PD3, he underwent UGI series which was without evidence of leak. NGT was removed and liquid diet initiated. Ambulation was well-tolerated. The remainder of patient's hospital course was essentially unremarkable, and discharge planning was initiated  accordingly with patient safely able to be discharged home with appropriate discharge instructions, antibiotics (Augmentin x4 days), pain control, and outpatient follow-up after all of his questions were answered to his expressed satisfaction.   Discharge Condition: Good   Physical Examination:  Constitutional: alert, cooperative and no distress HEENT: NGT in place (removed); minimal output Respiratory: breathing non-labored at rest  Cardiovascular: regular rate and sinus rhythm  Gastrointestinal:Soft, non-tender, non-distended, no rebound/guarding Integumentary:Laparoscopic incisions are CDI with dermabond, no erythema or drainage   Allergies as of 03/15/2020   No Known Allergies     Medication List    STOP taking these medications   meloxicam 15 MG tablet Commonly known as: MOBIC     TAKE these medications   amoxicillin-clavulanate 875-125 MG tablet Commonly known as: Augmentin Take 1 tablet by mouth 2 (two) times daily for 5 days.   buPROPion 150 MG 24 hr tablet Commonly known as: WELLBUTRIN XL Take 150 mg by mouth daily.   FLUoxetine 40 MG capsule Commonly known as: PROZAC Take 40 mg by mouth daily.   fluticasone 50 MCG/ACT nasal spray Commonly known as: FLONASE Place 2 sprays into both nostrils daily.   HYDROcodone-acetaminophen 5-325 MG tablet Commonly known as: NORCO/VICODIN Take 1 tablet by mouth every 6 (six) hours as needed for severe pain.   losartan 50 MG tablet Commonly known as: COZAAR Take 25 mg by mouth daily.   omeprazole 40 MG capsule Commonly known as: PRILOSEC Take 1 capsule (40 mg total) by mouth in the morning and at bedtime. What changed: when to take this         Follow-up Information    Campbell Lerner, MD. Schedule an appointment as soon as possible for a visit in 2 week(s).   Specialty: General  Surgery Why: s/p robotic graham patch  Contact information: [address] [place] [address] [phone]                 Time spent on discharge management including discussion of hospital course, clinical condition, outpatient instructions, prescriptions, and follow up with the patient and members of the medical team: >30 minutes  -- Lynden Oxford , PA-C  [facility]  03/15/2020, 1:25 PM [phone] M-F: 7am - 4pm

## 2020-03-27 ENCOUNTER — Ambulatory Visit (INDEPENDENT_AMBULATORY_CARE_PROVIDER_SITE_OTHER): Payer: BC Managed Care – PPO | Admitting: Physician Assistant

## 2020-03-27 ENCOUNTER — Encounter: Payer: BC Managed Care – PPO | Admitting: Physician Assistant

## 2020-03-27 ENCOUNTER — Other Ambulatory Visit: Payer: Self-pay

## 2020-03-27 ENCOUNTER — Encounter: Payer: Self-pay | Admitting: Physician Assistant

## 2020-03-27 VITALS — BP 124/71 | HR 67 | Temp 98.3°F | Ht 71.0 in | Wt 236.6 lb

## 2020-03-27 DIAGNOSIS — G4733 Obstructive sleep apnea (adult) (pediatric): Secondary | ICD-10-CM | POA: Insufficient documentation

## 2020-03-27 DIAGNOSIS — K275 Chronic or unspecified peptic ulcer, site unspecified, with perforation: Secondary | ICD-10-CM

## 2020-03-27 DIAGNOSIS — Z09 Encounter for follow-up examination after completed treatment for conditions other than malignant neoplasm: Secondary | ICD-10-CM

## 2020-03-27 DIAGNOSIS — K219 Gastro-esophageal reflux disease without esophagitis: Secondary | ICD-10-CM | POA: Insufficient documentation

## 2020-03-27 DIAGNOSIS — K5730 Diverticulosis of large intestine without perforation or abscess without bleeding: Secondary | ICD-10-CM | POA: Insufficient documentation

## 2020-03-27 NOTE — Progress Notes (Signed)
[facility] POST-OP OFFICE VISIT  03/27/2020  HPI: Jeremy [name] is a 45 y.o. male 15 days s/p robotic assisted laparoscopic repair of perforated jejunal marginal ulceration with omental Cheree Ditto patch with Dr Claudine Mouton  He has done fantastically since his surgery He has minimal, if any, abdominal pain. Not needing any pain medications No fever, chills, nausea, emesis He has gradually advanced his diet without any issues No problems with his incisions No other complaints He has called his surgeon at [facility] as well, and agreed to follow up with [place] as he is doing   Vital signs: BP 124/71   Pulse 67   Temp 98.3 F (36.8 C) (Oral)   Ht 5\' 11"  (1.803 m)   Wt 236 lb 9.6 oz (107.3 kg)   SpO2 97%   BMI 33.00 kg/m    Physical Exam: Constitutional: Well appearing male, NAD Abdomen: Soft, non-tender, non-distended, no rebound/guarding Skin: Laparoscopic incisions are healed well, no erythema or drainage, most left lateral laparoscopic site is indurated but no evidence of infection  Assessment/Plan: This is a 45 y.o. male 15 days s/p robotic assisted laparoscopic repair of perforated jejunal marginal ulceration with omental 59 patch   - He has done remarkably well  - Pain control prn; Tylenol / Motrin as needed  - Continue to re-introduce diet gradually and as tolerated  - Reviewed wound care  - Reviewed lifting restrictions  - He can follow up with Cheree Ditto, or his surgeon at [facility], on an as needed basis for now.   -- [facility], PA-C [facility] 03/27/2020, 1:47 PM [phone] M-F: 7am - 4pm

## 2020-08-28 ENCOUNTER — Other Ambulatory Visit: Payer: Self-pay

## 2020-08-28 MED ORDER — OMEPRAZOLE 40 MG PO CPDR: 40.0000 mg | DELAYED_RELEASE_CAPSULE | Freq: Two times a day (BID) | ORAL | 3 refills | Status: AC

## 2021-06-03 IMAGING — CT CT ABD-PELV W/ CM
2 of 5 series · 16 of 46 positions shown, 18 images · IV contrast (omnipaque)
Comparison: 12/16/2016

CLINICAL DATA: History of prior gastric bypass with left-sided
abdominal pain

EXAM:
CT ABDOMEN AND PELVIS WITH CONTRAST
TECHNIQUE: Multidetector CT imaging of the abdomen and pelvis was performed
using the standard protocol following bolus administration of
intravenous contrast.
CONTRAST:  100mL OMNIPAQUE IOHEXOL 300 MG/ML  SOLN

[Series 2: routine abd/pel with · axial · 0.98mm/px · z∈[-594,-89]mm · 13 of 113 slices shown, 15 images]
[im 6/113  soft-tissue]
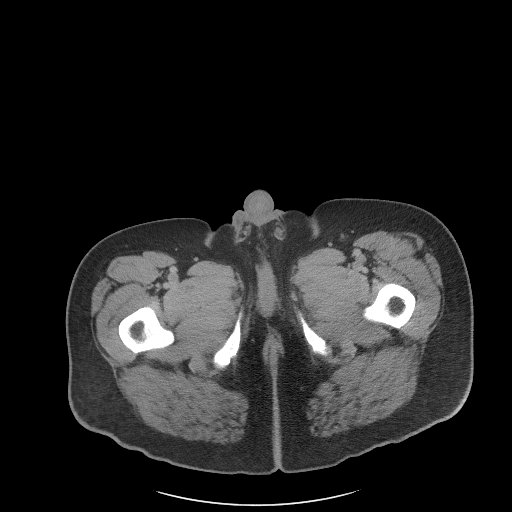
[im 6/113  bone]
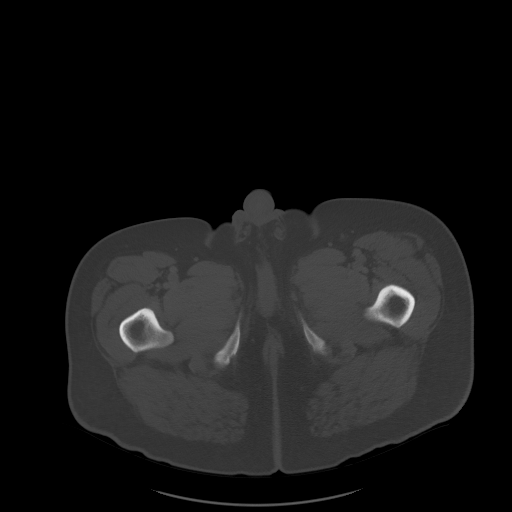
[im 18/113  soft-tissue]
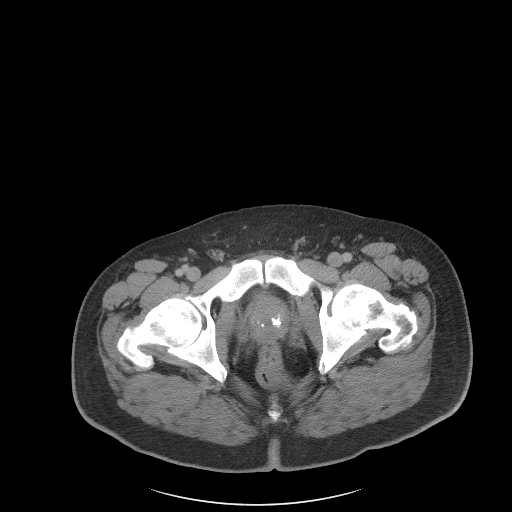
[im 24/113  soft-tissue]
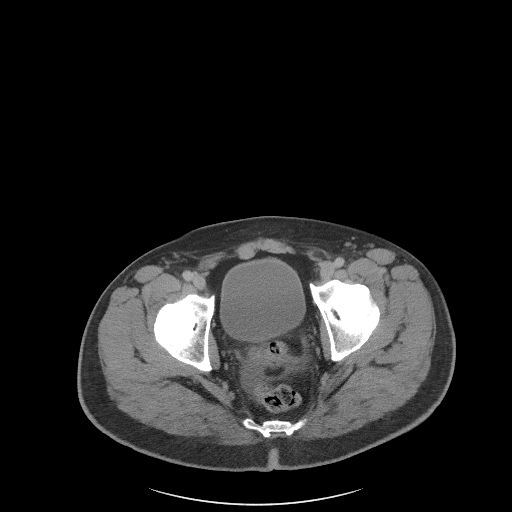
[im 30/113  soft-tissue]
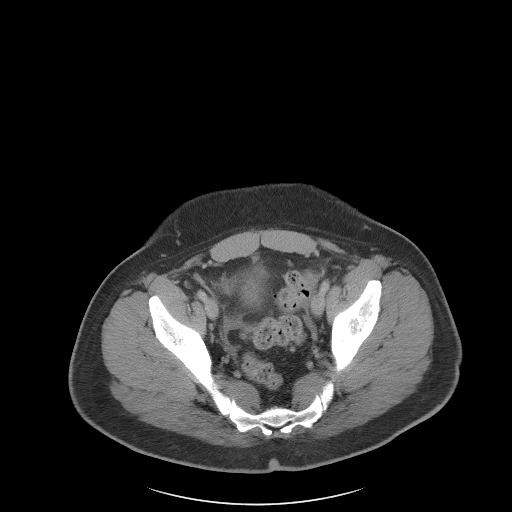
[im 42/113  soft-tissue]
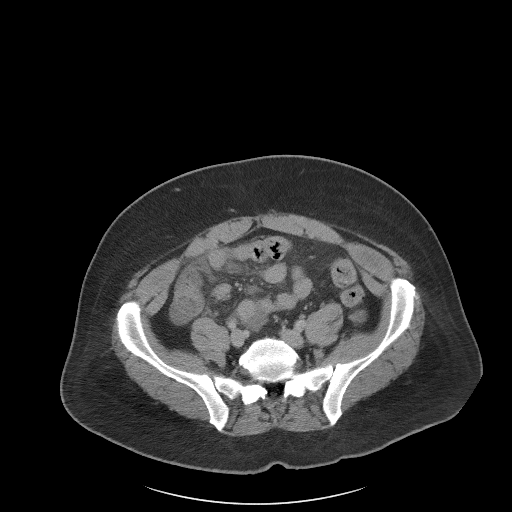
[im 48/113  soft-tissue]
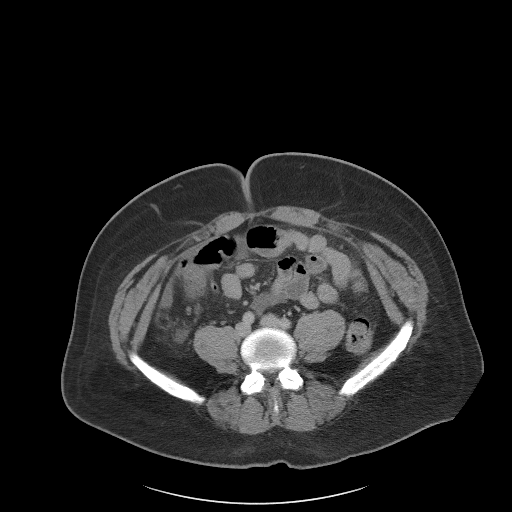
[im 59/113  soft-tissue]
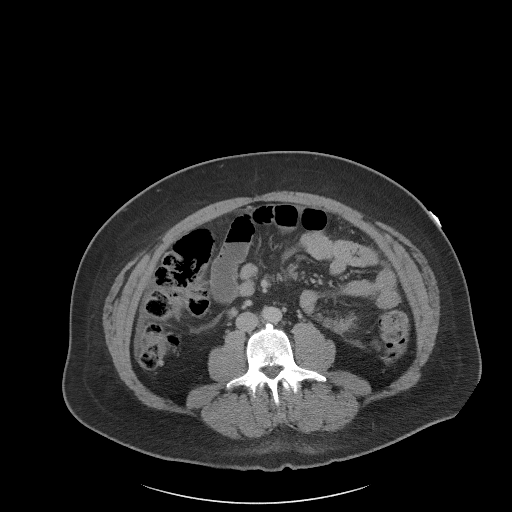
[im 65/113  soft-tissue]
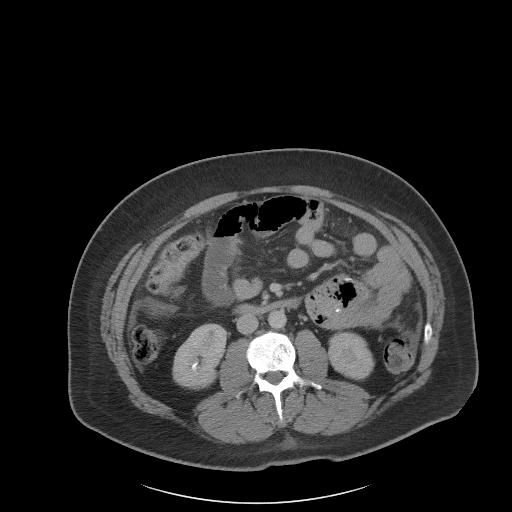
[im 71/113  soft-tissue]
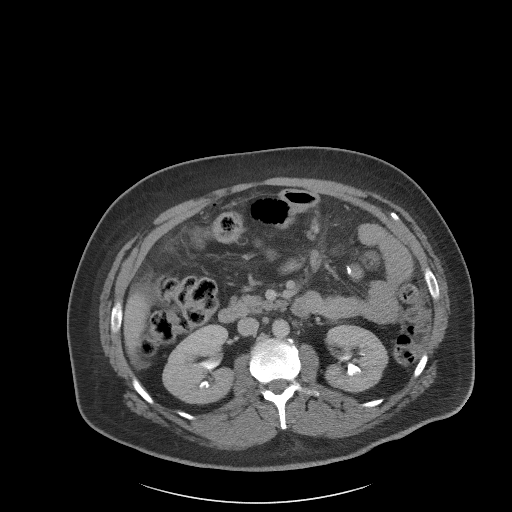
[im 71/113  bone]
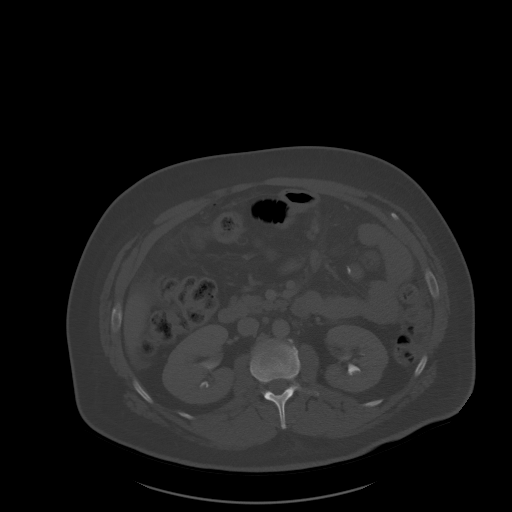
[im 83/113  soft-tissue]
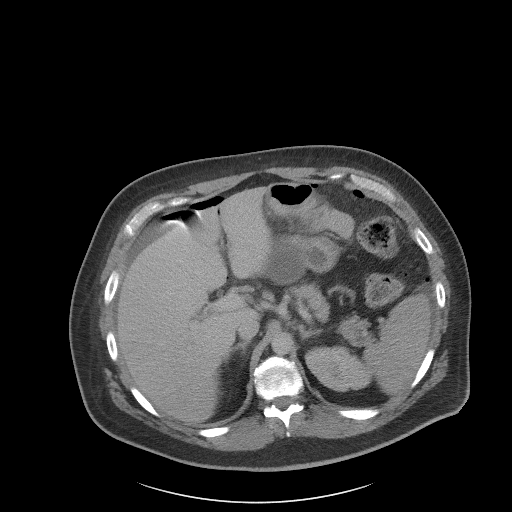
[im 89/113  soft-tissue]
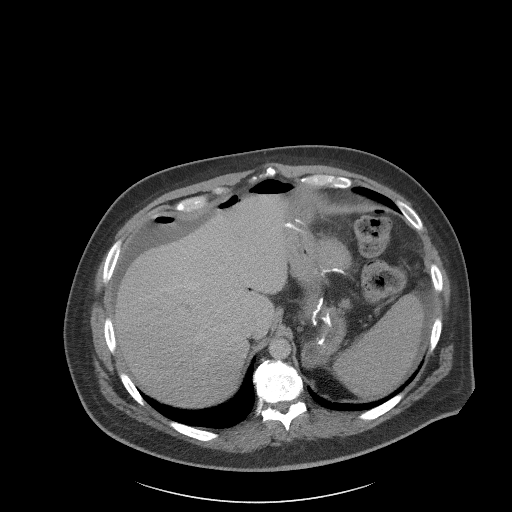
[im 95/113  soft-tissue]
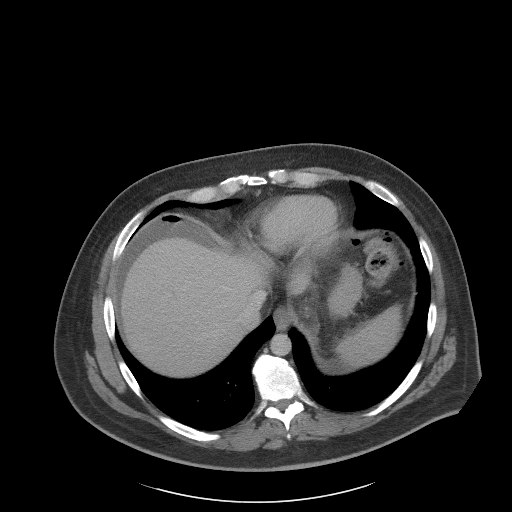
[im 107/113  soft-tissue]
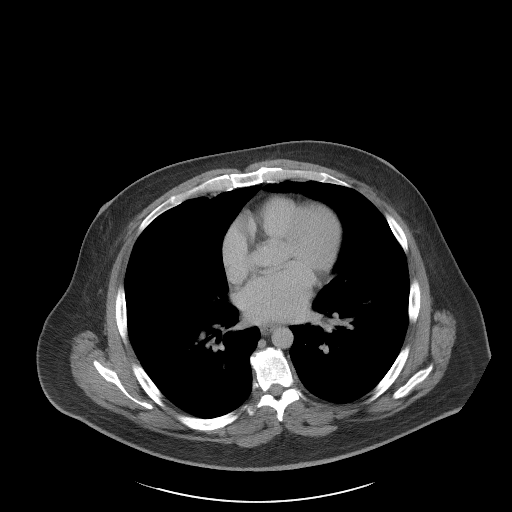

[Series 5: coronal st · coronal · 0.88mm/px · 3 of 110 slices shown]
[im 37/110  soft-tissue]
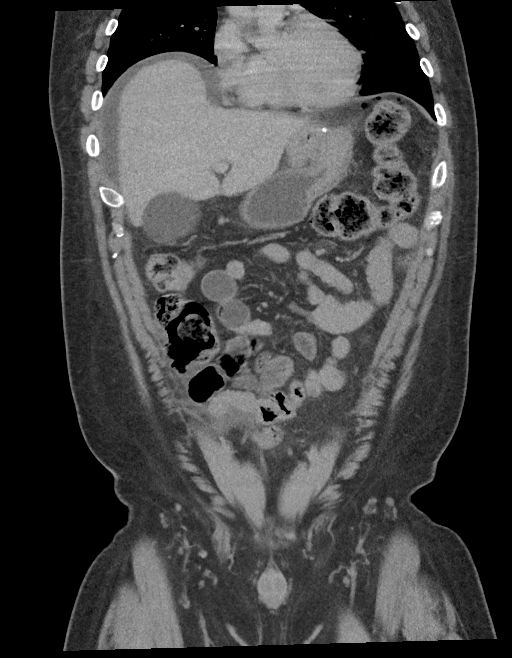
[im 49/110  soft-tissue]
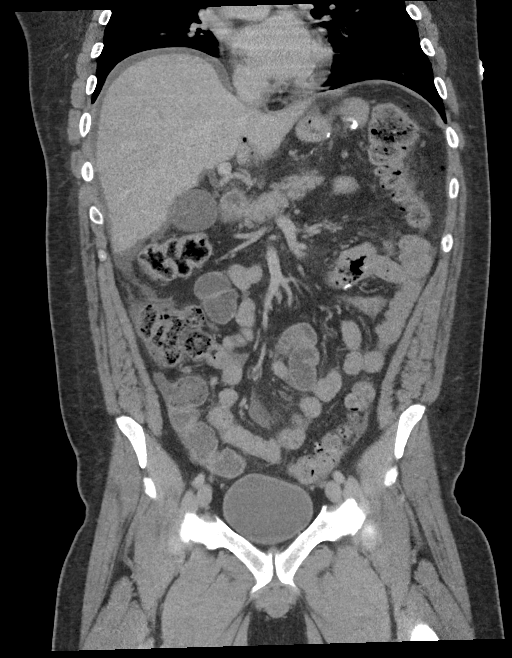
[im 61/110  soft-tissue]
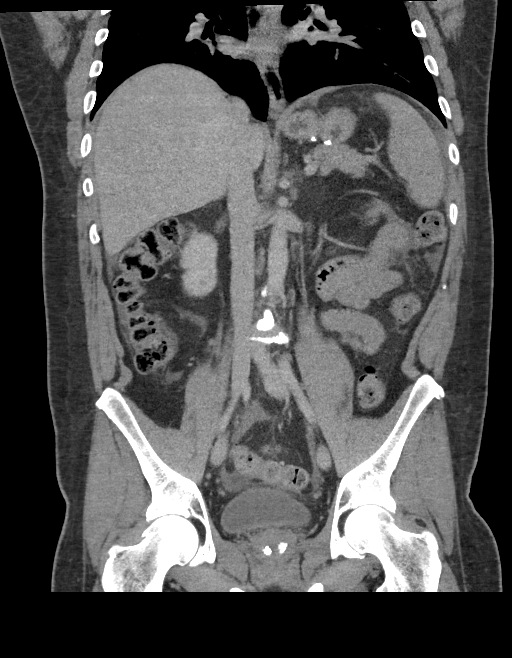

[16 of 46 positions shown; findings below may reference images not displayed]

FINDINGS: Lower chest: No acute abnormality.

Hepatobiliary: No focal liver abnormality is seen. No gallstones,
gallbladder wall thickening, or biliary dilatation.

Pancreas: Unremarkable. No pancreatic ductal dilatation or
surrounding inflammatory changes.

Spleen: Normal in size without focal abnormality.

Adrenals/Urinary Tract: Adrenal glands are within normal limits.
Kidneys demonstrate a normal enhancement pattern with normal
excretion of contrast material bilaterally. The bladder is well
distended.

Stomach/Bowel: The appendix is within normal limits. The colon shows
no obstructive or inflammatory changes. There are changes consistent
with prior gastric bypass surgery. Considerable inflammatory changes
noted about the residual stomach gastrojejunostomy anastomosis.
There are changes near the anastomosis best seen on axial image
number 28 of series 2 and coronal image number 26 of series 5
consistent with a perforated marginal ulcer. There is free air and
free fluid identified in the upper abdomen which appears to be
arising from the perforated ulcer. A few loops of mildly dilated
small bowel are noted within the right lower quadrant although no
true obstructive changes seen.

Vascular/Lymphatic: No significant vascular findings are present. No
enlarged abdominal or pelvic lymph nodes.

Reproductive: Prostate is unremarkable.

Other: Free fluid and free air is noted within the abdomen as
described above.

Musculoskeletal: Degenerative changes of lumbar spine are noted.
IMPRESSION: Changes consistent with a perforated marginal ulcer near the
gastrojejunostomy anastomosis with free air and free fluid
identified.

Mildly dilated small bowel though no true obstructive changes are
seen. This may be reactive secondary to the perforation.

## 2021-08-10 ENCOUNTER — Other Ambulatory Visit: Payer: Self-pay

## 2021-08-10 ENCOUNTER — Ambulatory Visit
Admission: RE | Admit: 2021-08-10 | Discharge: 2021-08-10 | Disposition: A | Discharge status: Home / Self Care | Payer: BC Managed Care – PPO | Source: Ambulatory Visit

## 2021-08-10 VITALS — BP 123/66 | HR 66 | Temp 98.6°F | Resp 15 | Ht 71.0 in | Wt 269.0 lb

## 2021-08-10 DIAGNOSIS — M7521 Bicipital tendinitis, right shoulder: Secondary | ICD-10-CM

## 2021-08-10 MED ORDER — PREDNISONE 20 MG PO TABS: 40.0000 mg | ORAL_TABLET | Freq: Every day | ORAL | 0 refills | Status: AC

## 2021-08-10 NOTE — ED Triage Notes (Signed)
Patient states that on Thursday he was putting up a basketball goal and felt a pop in his right shoulder.  Patient c/o right shoulder pain that radiates down his right arm.

## 2021-08-10 NOTE — Discharge Instructions (Addendum)
-  Prednisone, 2 pills taken at the same time for 5 days in a row.  Try taking this earlier in the day as it can give you energy. Avoid NSAIDs like ibuprofen and alleve while taking this medication as they can increase your risk of stomach upset and even GI bleeding when in combination with a steroid. You can continue tylenol (acetaminophen) up to 1000mg  3x daily. -Rest, ice, tylenol -Follow-up with orthopedist if symptoms persist - information below

## 2021-08-10 NOTE — ED Provider Notes (Signed)
[facility]    CSN: [id number] Arrival date & time: 08/10/21  0840      History   Chief Complaint Chief Complaint  Patient presents with   Shoulder Pain    right    HPI Jeremy [name] is a 46 y.o. male presenting with right shoulder pain for the last 3 days.  History noncontributory.  Patient states that he was setting up a basketball hoop, while he was raising his arms above his head, he heard a pop and felt pain over the R shoulder. Since then, no pain at rest, but pain with forward flexion over the biceps tendon. He tried a lidocaine patch with some relief. States he has a f/u scheduled with PCP on 6/8 but can't wait until then.   HPI  Past Medical History:  Diagnosis Date   Family history of adverse reaction to anesthesia    MOMS BP DROPPED AND WAS SENT TO CCU   GERD (gastroesophageal reflux disease)    Sleep apnea    CPAP    Patient Active Problem List   Diagnosis Date Noted   Diverticula of colon 03/27/2020   GERD (gastroesophageal reflux disease) 03/27/2020   Severe obstructive sleep apnea 03/27/2020   Perforated viscus 03/12/2020   Perforated ulcer (HCC) 03/12/2020   S/P gastric bypass 09/08/2019   Morbid obesity (HCC) 08/22/2019   Morbid obesity with body mass index of 50.0-59.9 in adult (HCC) 03/24/2019   Steatosis of liver 02/06/2019   Closed fracture of sesamoid bone of right foot 11/01/2018    Past Surgical History:  Procedure Laterality Date   ACHILLES TENDON SURGERY Left 2011   screws   COLON SURGERY  2013   PERFORATED INTESTINE   ETHMOIDECTOMY Left 05/05/2016   Procedure: ETHMOIDECTOMY;  Surgeon: Vernie Murders, MD;  Location: [facility];  Service: ENT;  Laterality: Left;   FRONTAL SINUS EXPLORATION Left 05/05/2016   Procedure: FRONTAL SINUS EXPLORATION;  Surgeon: Vernie Murders, MD;  Location: [facility];  Service: ENT;  Laterality: Left;   HERNIA REPAIR     AS A BABY   IMAGE GUIDED SINUS SURGERY N/A 05/05/2016   Procedure: IMAGE GUIDED SINUS  SURGERY;  Surgeon: Vernie Murders, MD;  Location: [facility];  Service: ENT;  Laterality: N/A;   MAXILLARY ANTROSTOMY Bilateral 05/05/2016   Procedure: MAXILLARY ANTROSTOMY;  Surgeon: Vernie Murders, MD;  Location: [facility];  Service: ENT;  Laterality: Bilateral;   NASAL SEPTUM SURGERY  2009   XI ROBOT ASSISTED DIAGNOSTIC LAPAROSCOPY N/A 03/12/2020   Procedure: XI ROBOT ASSISTED DIAGNOSTIC LAPAROSCOPY; REPAIR OF PERFORATED MARGINAL ULCER Peggye Ley;  Surgeon: Campbell Lerner, MD;  Location: [facility];  Service: General;  Laterality: N/A;       Home Medications    Prior to Admission medications   Medication Sig Start Date End Date Taking? Authorizing Provider  buPROPion (WELLBUTRIN XL) 150 MG 24 hr tablet Take 150 mg by mouth daily.   Yes [provider]  escitalopram (LEXAPRO) 10 MG tablet Take by mouth. 07/25/21 10/23/21 Yes [provider]  losartan (COZAAR) 50 MG tablet Take 25 mg by mouth daily.   Yes [provider]  omeprazole (PRILOSEC) 40 MG capsule Take 1 capsule (40 mg total) by mouth in the morning and at bedtime. 08/28/20 08/10/21 Yes Donovan Kail, PA-C  predniSONE (DELTASONE) 20 MG tablet Take 2 tablets (40 mg total) by mouth daily for 5 days. Take with breakfast or lunch. Avoid NSAIDs (ibuprofen, etc) while taking this medication. 08/10/21 08/15/21  Yes Rhys Martini PA-C    Family History History reviewed. No pertinent family history.  Social History Social History   Tobacco Use   Smoking status: Former    Packs/day: 1.00    Years: 13.00    Pack years: 13.00    Types: Cigarettes    Quit date: 04/30/2007    Years since quitting: 14.2   Smokeless tobacco: Current    Types: Chew  Vaping Use   Vaping Use: Never used  Substance Use Topics   Alcohol use: Yes    Comment: RARE   Drug use: No     Allergies   Meperidine and No known allergies   Review of Systems Review of Systems  Musculoskeletal:        R shoulder pain     Physical  Exam Triage Vital Signs ED Triage Vitals  Enc Vitals Group     BP 08/10/21 0853 123/66     Pulse Rate 08/10/21 0853 66     Resp 08/10/21 0853 15     Temp 08/10/21 0853 98.6 F (37 C)     Temp Source 08/10/21 0853 Oral     SpO2 08/10/21 0853 97 %     Weight 08/10/21 0849 269 lb (122 kg)     Height 08/10/21 0849 5\' 11"  (1.803 m)     Head Circumference --      Peak Flow --      Pain Score 08/10/21 0849 6     Pain Loc --      Pain Edu? --      Excl. in GC? --    No data found.  Updated Vital Signs BP 123/66 (BP Location: Left Arm)   Pulse 66   Temp 98.6 F (37 C) (Oral)   Resp 15   Ht 5\' 11"  (1.803 m)   Wt 269 lb (122 kg)   SpO2 97%   BMI 37.52 kg/m   Visual Acuity Right Eye Distance:   Left Eye Distance:   Bilateral Distance:    Right Eye Near:   Left Eye Near:    Bilateral Near:     Physical Exam Vitals reviewed.  Constitutional:      General: He is not in acute distress.    Appearance: Normal appearance. He is not ill-appearing.  HENT:     Head: Normocephalic and atraumatic.  Pulmonary:     Effort: Pulmonary effort is normal.  Musculoskeletal:     Comments: R shoulder - no skin changes or swelling. No tenderness to palpation of the bicipital groove or humerus. Pain elicited over the proximal biceps tendon with forward flexion of the arm. Positive Yergason's test. Negative crossbody adduction. No AC joint or rotator cuff tendonitis. Radial pulse 2+, grip strength 5/5.   Neurological:     General: No focal deficit present.     Mental Status: He is alert and oriented to person, place, and time.  Psychiatric:        Mood and Affect: Mood normal.        Behavior: Behavior normal.        Thought Content: Thought content normal.        Judgment: Judgment normal.     UC Treatments / Results  Labs (all labs ordered are listed, but only abnormal results are displayed) Labs Reviewed - No data to display  EKG   Radiology No results  found.  Procedures Procedures (including critical care time)  Medications Ordered in UC Medications - No data  to display  Initial Impression / Assessment and Plan / UC Course  I have reviewed the triage vital signs and the nursing notes.  Pertinent labs & imaging results that were available during my care of the patient were reviewed by me and considered in my medical decision making (see chart for details).     This patient is a very pleasant 46 y.o. year old male presenting with biceps tendonitis. Neurovascularly intact. There is not obvious deformity or "popeye" deformity, so low concern for acute tear. There is no bony deformity or pain, so declines xray, I am in agreement. Will proceed with prednisone, RICE, f/u with PCP as scheduled 08/15/21. ED return precautions discussed. Patient verbalizes understanding and agreement.    Final Clinical Impressions(s) / UC Diagnoses   Final diagnoses:  Biceps tendinitis of right upper extremity     Discharge Instructions      -Prednisone, 2 pills taken at the same time for 5 days in a row.  Try taking this earlier in the day as it can give you energy. Avoid NSAIDs like ibuprofen and alleve while taking this medication as they can increase your risk of stomach upset and even GI bleeding when in combination with a steroid. You can continue tylenol (acetaminophen) up to 1000mg  3x daily. -Rest, ice, tylenol -Follow-up with orthopedist if symptoms persist - information below    ED Prescriptions     Medication Sig Dispense Auth. Provider   predniSONE (DELTASONE) 20 MG tablet Take 2 tablets (40 mg total) by mouth daily for 5 days. Take with breakfast or lunch. Avoid NSAIDs (ibuprofen, etc) while taking this medication. 10 tablet Rhys MartiniGraham, Khilynn Borntreger E, PA-C      PDMP not reviewed this encounter.   Rhys MartiniGraham, Savva Beamer E, PA-C 08/10/21 [phone]

## 2021-10-01 ENCOUNTER — Ambulatory Visit
Admission: RE | Admit: 2021-10-01 | Discharge: 2021-10-01 | Disposition: A | Discharge status: Home / Self Care | Payer: BC Managed Care – PPO | Source: Ambulatory Visit | Attending: Emergency Medicine | Admitting: Emergency Medicine

## 2021-10-01 VITALS — BP 120/57 | HR 59 | Temp 98.6°F | Ht 71.0 in | Wt 280.0 lb

## 2021-10-01 DIAGNOSIS — B029 Zoster without complications: Secondary | ICD-10-CM

## 2021-10-01 MED ORDER — VALACYCLOVIR HCL 1 G PO TABS
1000.0000 mg | ORAL_TABLET | Freq: Three times a day (TID) | ORAL | 0 refills | Status: AC
Start: 2021-10-01 — End: ?

## 2021-10-01 NOTE — ED Provider Notes (Signed)
[facility]    CSN: [id number] Arrival date & time: 10/01/21  1657      History   Chief Complaint Chief Complaint  Patient presents with   Rash    HPI Jeremy [name] is a 46 y.o. male.   HPI  51 old male here for evaluation of rash.  Patient reports that he developed a rash on his left forehead that is painful and itching 5 days ago.  He states that he thought it was a pimple and he tried to pop it so he has some scabbed lesions.  The rash is red, raised, and isolated on the left side of the forehead and scalp only.  Patient denies any pain in his eye or changes to his vision.  Past Medical History:  Diagnosis Date   Family history of adverse reaction to anesthesia    MOMS BP DROPPED AND WAS SENT TO CCU   GERD (gastroesophageal reflux disease)    Sleep apnea    CPAP    Patient Active Problem List   Diagnosis Date Noted   Diverticula of colon 03/27/2020   GERD (gastroesophageal reflux disease) 03/27/2020   Severe obstructive sleep apnea 03/27/2020   Perforated viscus 03/12/2020   Perforated ulcer (HCC) 03/12/2020   S/P gastric bypass 09/08/2019   Morbid obesity (HCC) 08/22/2019   Morbid obesity with body mass index of 50.0-59.9 in adult (HCC) 03/24/2019   Steatosis of liver 02/06/2019   Closed fracture of sesamoid bone of right foot 11/01/2018    Past Surgical History:  Procedure Laterality Date   ACHILLES TENDON SURGERY Left 2011   screws   COLON SURGERY  2013   PERFORATED INTESTINE   ETHMOIDECTOMY Left 05/05/2016   Procedure: ETHMOIDECTOMY;  Surgeon: Vernie Murders, MD;  Location: [facility];  Service: ENT;  Laterality: Left;   FRONTAL SINUS EXPLORATION Left 05/05/2016   Procedure: FRONTAL SINUS EXPLORATION;  Surgeon: Vernie Murders, MD;  Location: [facility];  Service: ENT;  Laterality: Left;   HERNIA REPAIR     AS A BABY   IMAGE GUIDED SINUS SURGERY N/A 05/05/2016   Procedure: IMAGE GUIDED SINUS SURGERY;  Surgeon: Vernie Murders, MD;  Location: [facility];   Service: ENT;  Laterality: N/A;   MAXILLARY ANTROSTOMY Bilateral 05/05/2016   Procedure: MAXILLARY ANTROSTOMY;  Surgeon: Vernie Murders, MD;  Location: [facility];  Service: ENT;  Laterality: Bilateral;   NASAL SEPTUM SURGERY  2009   XI ROBOT ASSISTED DIAGNOSTIC LAPAROSCOPY N/A 03/12/2020   Procedure: XI ROBOT ASSISTED DIAGNOSTIC LAPAROSCOPY; REPAIR OF PERFORATED MARGINAL ULCER Peggye Ley;  Surgeon: Campbell Lerner, MD;  Location: [facility];  Service: General;  Laterality: N/A;       Home Medications    Prior to Admission medications   Medication Sig Start Date End Date Taking? Authorizing Provider  buPROPion (WELLBUTRIN XL) 150 MG 24 hr tablet Take 150 mg by mouth daily.   Yes [provider]  escitalopram (LEXAPRO) 20 MG tablet Take 10 mg by mouth daily. 07/25/21 10/23/21 Yes [provider]  losartan (COZAAR) 25 MG tablet Take 25 mg by mouth daily.   Yes [provider]  omeprazole (PRILOSEC) 40 MG capsule Take 1 capsule (40 mg total) by mouth in the morning and at bedtime. 08/28/20  Yes Donovan Kail, PA-C  valACYclovir (VALTREX) 1000 MG tablet Take 1 tablet (1,000 mg total) by mouth 3 (three) times daily. 10/01/21  Yes Becky Augusta, NP    Family History History reviewed. No pertinent family history.  Social History Social History   Tobacco Use   Smoking status: Former    Packs/day: 1.00    Years: 13.00    Total pack years: 13.00    Types: Cigarettes    Quit date: 04/30/2007    Years since quitting: 14.4   Smokeless tobacco: Current    Types: Chew  Vaping Use   Vaping Use: Never used  Substance Use Topics   Alcohol use: Yes    Comment: RARE   Drug use: No     Allergies   Meperidine and No known allergies   Review of Systems Review of Systems  Eyes:  Negative for visual disturbance.  Skin:  Positive for rash.  Hematological: Negative.   Psychiatric/Behavioral: Negative.       Physical Exam Triage Vital Signs ED Triage Vitals  Enc  Vitals Group     BP 10/01/21 1716 (!) 120/57     Pulse Rate 10/01/21 1716 (!) 59     Resp --      Temp 10/01/21 1716 98.6 F (37 C)     Temp Source 10/01/21 1716 Oral     SpO2 10/01/21 1716 100 %     Weight 10/01/21 1713 280 lb (127 kg)     Height 10/01/21 1713 5\' 11"  (1.803 m)     Head Circumference --      Peak Flow --      Pain Score 10/01/21 1713 5     Pain Loc --      Pain Edu? --      Excl. in GC? --    No data found.  Updated Vital Signs BP (!) 120/57 (BP Location: Left Arm)   Pulse (!) 59   Temp 98.6 F (37 C) (Oral)   Ht 5\' 11"  (1.803 m)   Wt 280 lb (127 kg)   SpO2 100%   BMI 39.05 kg/m   Visual Acuity Right Eye Distance:   Left Eye Distance:   Bilateral Distance:    Right Eye Near:   Left Eye Near:    Bilateral Near:     Physical Exam Vitals and nursing note reviewed.  Constitutional:      Appearance: Normal appearance. He is not ill-appearing.  Eyes:     General: No scleral icterus.    Extraocular Movements: Extraocular movements intact.     Pupils: Pupils are equal, round, and reactive to light.  Skin:    General: Skin is warm and dry.     Capillary Refill: Capillary refill takes less than 2 seconds.     Findings: Erythema and rash present.  Neurological:     General: No focal deficit present.     Mental Status: He is alert and oriented to person, place, and time.  Psychiatric:        Mood and Affect: Mood normal.        Behavior: Behavior normal.        Thought Content: Thought content normal.        Judgment: Judgment normal.      UC Treatments / Results  Labs (all labs ordered are listed, but only abnormal results are displayed) Labs Reviewed - No data to display  EKG   Radiology No results found.  Procedures Procedures (including critical care time)  Medications Ordered in UC Medications - No data to display  Initial Impression / Assessment and Plan / UC Course  I have reviewed the triage vital signs and the nursing  notes.  Pertinent labs &  imaging results that were available during my care of the patient were reviewed by me and considered in my medical decision making (see chart for details).  Patient is a pleasant, nontoxic-appearing 33 old male here for evaluation of a erythematous, maculopapular, painful and itchy rash on the left side of his forehead that began 5 days ago.  Patient has several clusters of lesions that extend linear from his forehead up onto the frontal parietal area of his scalp.  No vesicles or pustules noted.  The lesions do not extend down below the eyebrow or onto the eye.  His pupils equal round reactive and his EOM is intact.  The lesions are isolated to a single dermatome and do not cross midline.  Patient exam is consistent with shingles.  I will start him on Valtrex 3 times daily x7 days.  He can use over-the-counter Tylenol and ibuprofen as needed for pain.  I have advised him that if he develops any lesions down onto his eyelid going into his eye, pain in his eye, or changes in vision that he needs to consult an ophthalmologist or go to the ER.   Final Clinical Impressions(s) / UC Diagnoses   Final diagnoses:  Herpes zoster without complication     Discharge Instructions      Take the Valtrex 3 times a day for 7 days for treatment of shingles.  Use OTC Tylenol and Ibuprofen as needed for pain.  If you develop pain in your left eye or changes in your vision you need to go to the ER or see an ophthalmologist.      ED Prescriptions     Medication Sig Dispense Auth. Provider   valACYclovir (VALTREX) 1000 MG tablet Take 1 tablet (1,000 mg total) by mouth 3 (three) times daily. 21 tablet Becky Augusta, NP      PDMP not reviewed this encounter.   Becky Augusta, NP 10/01/21 1743

## 2021-10-01 NOTE — ED Triage Notes (Signed)
Patient c/o rash on the left side of his forehead and it is spreading across his head and down his neck -- started Thursday.   Patient recently just got back from the [place].

## 2021-10-01 NOTE — Discharge Instructions (Signed)
Take the Valtrex 3 times a day for 7 days for treatment of shingles.  Use OTC Tylenol and Ibuprofen as needed for pain.  If you develop pain in your left eye or changes in your vision you need to go to the ER or see an ophthalmologist.

## 2023-04-23 ENCOUNTER — Encounter: Payer: Self-pay | Admitting: *Deleted

## 2023-04-30 ENCOUNTER — Ambulatory Visit
Admission: EM | Admit: 2023-04-30 | Discharge: 2023-04-30 | Disposition: A | Discharge status: Home / Self Care | Payer: BC Managed Care – PPO | Attending: Emergency Medicine | Admitting: Emergency Medicine

## 2023-04-30 DIAGNOSIS — J101 Influenza due to other identified influenza virus with other respiratory manifestations: Secondary | ICD-10-CM | POA: Diagnosis not present

## 2023-04-30 LAB — GROUP A STREP BY PCR: Group A Strep by PCR: NOT DETECTED

## 2023-04-30 LAB — RESP PANEL BY RT-PCR (FLU A&B, COVID) ARPGX2
Influenza A by PCR: POSITIVE — AB
Influenza B by PCR: NEGATIVE
SARS Coronavirus 2 by RT PCR: NEGATIVE

## 2023-04-30 MED ORDER — OSELTAMIVIR PHOSPHATE 75 MG PO CAPS
75.0000 mg | ORAL_CAPSULE | Freq: Two times a day (BID) | ORAL | 0 refills | Status: AC
Start: 2023-04-30 — End: ?

## 2023-04-30 NOTE — ED Triage Notes (Signed)
 Sx x 1 days Sore throat Bodyaches Headache

## 2023-04-30 NOTE — Discharge Instructions (Addendum)
 Strep is negative You have tested positive for Influenza A. Alternate tylenol/ibuprofen as label directed. Take tamiflu as directed. Push fluids. If you have new or worsening issues or concerns(chest pain,palpitations, shortness of breeath, go to Er for further evaluation).

## 2023-04-30 NOTE — ED Provider Notes (Signed)
 [facility]    CSN: [id number] Arrival date & time: 04/30/23  1210      History   Chief Complaint Chief Complaint  Patient presents with   Cough   Sore Throat   Headache   Generalized Body Aches    HPI Jeremy [name] is a 48 y.o. male.   48 year old Jeremy [name], presents to urgent care for evaluation of sore throat, body aches, headache for 1 day.  No meds taken prior to arrival.  Patient states he is supposed to go to town for work and wants to make sure he is not contagious.  The history is provided by the patient. No language interpreter was used.    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   Morbid obesity (HCC) 08/22/2019   Morbid obesity with body mass index of 50.0-59.9 in adult (HCC) 03/24/2019   Steatosis of liver 02/06/2019   Closed fracture of sesamoid bone of right foot 11/01/2018    Past Surgical History:  Procedure Laterality Date   ACHILLES TENDON SURGERY Left 2011   screws   COLON SURGERY  2013   PERFORATED INTESTINE   DIAGNOSTIC LAPAROSCOPY     ETHMOIDECTOMY Left 05/05/2016   Procedure: ETHMOIDECTOMY;  Surgeon: Vernie Murders, MD;  Location: [facility];  Service: ENT;  Laterality: Left;   FRONTAL SINUS EXPLORATION Left 05/05/2016   Procedure: FRONTAL SINUS EXPLORATION;  Surgeon: Vernie Murders, MD;  Location: [facility];  Service: ENT;  Laterality: Left;   HERNIA REPAIR     AS A BABY   IMAGE GUIDED SINUS SURGERY N/A 05/05/2016   Procedure: IMAGE GUIDED SINUS  SURGERY;  Surgeon: Vernie Murders, MD;  Location: [facility];  Service: ENT;  Laterality: N/A;   MAXILLARY ANTROSTOMY Bilateral 05/05/2016   Procedure: MAXILLARY ANTROSTOMY;  Surgeon: Vernie Murders, MD;  Location: [facility];  Service: ENT;  Laterality: Bilateral;   NASAL SEPTUM SURGERY  2009   XI ROBOT ASSISTED DIAGNOSTIC LAPAROSCOPY N/A 03/12/2020   Procedure: XI ROBOT ASSISTED DIAGNOSTIC LAPAROSCOPY; REPAIR OF PERFORATED MARGINAL ULCER Peggye Ley;  Surgeon: Campbell Lerner, MD;  Location: [facility];  Service: General;  Laterality: N/A;       Home Medications    Prior to Admission medications   Medication Sig Start Date End Date Taking? Authorizing Provider  buPROPion (WELLBUTRIN XL) 150 MG 24 hr tablet Take 150 mg by mouth daily.   Yes [provider]  losartan (COZAAR) 25 MG tablet Take 25 mg by mouth daily.   Yes [provider]  oseltamivir (TAMIFLU) 75 MG capsule Take 1 capsule (75 mg total) by mouth every 12 (twelve) hours. 04/30/23  Yes Hrithik Boschee, Para March, NP  valACYclovir (VALTREX) 1000 MG tablet Take 1 tablet (1,000 mg total) by mouth 3 (three) times daily. 10/01/21  Yes Becky Augusta, NP  escitalopram (LEXAPRO) 20 MG tablet Take 10 mg by mouth daily. 07/25/21 10/23/21  [provider]  omeprazole (PRILOSEC) 40 MG capsule Take 1  capsule (40 mg total) by mouth in the morning and at bedtime. 08/28/20   Donovan Kail, PA-C    Family History History reviewed. No pertinent family history.  Social History Social History   Tobacco Use   Smoking status: Former    Current packs/day: 0.00    Average packs/day: 1 pack/day for 13.0 years (13.0 ttl pk-yrs)    Types: Cigarettes    Start date: 04/29/1994    Quit date: 04/30/2007    Years since quitting: 16.0   Smokeless tobacco: Current    Types: Chew  Vaping Use   Vaping status: Never Used  Substance Use Topics   Alcohol use: Yes    Comment: RARE   Drug use: No     Allergies   Meperidine and No known  allergies   Review of Systems Review of Systems  Constitutional:  Negative for fever.  HENT:  Positive for congestion and sore throat.   Musculoskeletal:  Positive for myalgias.  Neurological:  Positive for headaches.  All other systems reviewed and are negative.    Physical Exam Triage Vital Signs ED Triage Vitals  Encounter Vitals Group     BP 04/30/23 1405 135/71     Systolic BP Percentile --      Diastolic BP Percentile --      Pulse Rate 04/30/23 1405 75     Resp 04/30/23 1405 17     Temp 04/30/23 1405 98.8 F (37.1 C)     Temp Source 04/30/23 1405 Oral     SpO2 04/30/23 1405 100 %     Weight --      Height --      Head Circumference --      Peak Flow --      Pain Score 04/30/23 1404 5     Pain Loc --      Pain Education --      Exclude from Growth Chart --    No data found.  Updated Vital Signs BP 135/71 (BP Location: Left Arm)   Pulse 75   Temp 98.8 F (37.1 C) (Oral)   Resp 17   SpO2 100%   Visual Acuity Right Eye Distance:   Left Eye Distance:   Bilateral Distance:    Right Eye Near:   Left Eye Near:    Bilateral Near:     Physical Exam Vitals and nursing note reviewed.  Constitutional:      General: He is not in acute distress.    Appearance: He is well-developed. He is not ill-appearing or toxic-appearing.  HENT:     Head: Normocephalic.     Right Ear: Tympanic membrane is retracted.     Left Ear: Tympanic membrane is retracted.     Nose: Mucosal edema and congestion present.     Mouth/Throat:     Lips: Pink.     Mouth: Mucous membranes are moist.     Pharynx: Oropharynx is clear. Uvula midline.  Eyes:     General: Lids are normal.     Conjunctiva/sclera: Conjunctivae normal.     Pupils: Pupils are equal, round, and reactive to light.  Cardiovascular:     Rate and Rhythm: Normal rate and regular rhythm.     Heart sounds: Normal heart sounds.  Pulmonary:     Effort: Pulmonary effort is normal. No respiratory distress.     Breath  sounds: Normal breath sounds. No decreased breath sounds or wheezing.  Abdominal:     General: There is no  distension.     Palpations: Abdomen is soft.  Musculoskeletal:        General: Normal range of motion.     Cervical back: Normal range of motion.  Skin:    General: Skin is warm and dry.     Findings: No rash.  Neurological:     General: No focal deficit present.     Mental Status: He is alert and oriented to person, place, and time.     GCS: GCS eye subscore is 4. GCS verbal subscore is 5. GCS motor subscore is 6.     Cranial Nerves: No cranial nerve deficit.     Sensory: No sensory deficit.  Psychiatric:        Attention and Perception: Attention normal.        Mood and Affect: Mood normal.        Speech: Speech normal.        Behavior: Behavior normal. Behavior is cooperative.      UC Treatments / Results  Labs (all labs ordered are listed, but only abnormal results are displayed) Labs Reviewed  RESP PANEL BY RT-PCR (FLU A&B, COVID) ARPGX2 - Abnormal; Notable for the following components:      Result Value   Influenza A by PCR POSITIVE (*)    All other components within normal limits  GROUP A STREP BY PCR    EKG   Radiology No results found.  Procedures Procedures (including critical care time)  Medications Ordered in UC Medications - No data to display  Initial Impression / Assessment and Plan / UC Course  I have reviewed the triage vital signs and the nursing notes.  Pertinent labs & imaging results that were available during my care of the patient were reviewed by me and considered in my medical decision making (see chart for details).    Discussed exam findings and plan of care with patient, Tamiflu scripted , strict go to ER precautions given.   Patient verbalized understanding to this provider.  Ddx: Influenza A, viral illness, allergies Final Clinical Impressions(s) / UC Diagnoses   Final diagnoses:  Influenza A     Discharge Instructions       Strep is negative You have tested positive for Influenza A. Alternate tylenol/ibuprofen as label directed. Take tamiflu as directed. Push fluids. If you have new or worsening issues or concerns(chest pain,palpitations, shortness of breeath, go to Er for further evaluation).      ED Prescriptions     Medication Sig Dispense Auth. Provider   oseltamivir (TAMIFLU) 75 MG capsule Take 1 capsule (75 mg total) by mouth every 12 (twelve) hours. 10 capsule Jozelynn Danielson, Para March, NP      PDMP not reviewed this encounter.   Clancy Gourd, NP 04/30/23 [phone]

## 2023-05-04 ENCOUNTER — Ambulatory Visit: Admission: RE | Admit: 2023-05-04 | Payer: BC Managed Care – PPO | Source: Home / Self Care

## 2023-05-04 HISTORY — DX: Allergic rhinitis, unspecified: J30.9

## 2023-05-04 SURGERY — COLONOSCOPY WITH PROPOFOL
Anesthesia: General

## 2023-10-16 ENCOUNTER — Other Ambulatory Visit (HOSPITAL_COMMUNITY): Payer: Self-pay
